# Patient Record
Sex: Female | Born: 1955
Health system: Southern US, Community
[De-identification: ages and names within clinical notes are randomized; demographics above are authoritative.]

## PROBLEM LIST (undated history)

## (undated) HISTORY — PX: ABDOMINAL HYSTERECTOMY: SHX81

## (undated) HISTORY — PX: CHOLECYSTECTOMY: SHX55

---

## 1998-11-10 ENCOUNTER — Encounter: Payer: Self-pay | Admitting: Emergency Medicine

## 1998-11-10 ENCOUNTER — Emergency Department (HOSPITAL_COMMUNITY): Admission: EM | Admit: 1998-11-10 | Discharge: 1998-11-10 | Payer: Self-pay

## 2000-05-27 ENCOUNTER — Encounter: Payer: Self-pay | Admitting: Emergency Medicine

## 2000-05-27 ENCOUNTER — Emergency Department (HOSPITAL_COMMUNITY): Admission: EM | Admit: 2000-05-27 | Discharge: 2000-05-27 | Payer: Self-pay | Admitting: Emergency Medicine

## 2000-07-06 ENCOUNTER — Other Ambulatory Visit: Admission: RE | Admit: 2000-07-06 | Discharge: 2000-07-06 | Payer: Self-pay | Admitting: Obstetrics and Gynecology

## 2001-02-02 ENCOUNTER — Other Ambulatory Visit: Admission: RE | Admit: 2001-02-02 | Discharge: 2001-02-02 | Payer: Self-pay | Admitting: *Deleted

## 2001-02-28 ENCOUNTER — Encounter (INDEPENDENT_AMBULATORY_CARE_PROVIDER_SITE_OTHER): Payer: Self-pay | Admitting: Specialist

## 2001-02-28 ENCOUNTER — Other Ambulatory Visit: Admission: RE | Admit: 2001-02-28 | Discharge: 2001-02-28 | Payer: Self-pay | Admitting: Obstetrics and Gynecology

## 2001-03-21 ENCOUNTER — Encounter (INDEPENDENT_AMBULATORY_CARE_PROVIDER_SITE_OTHER): Payer: Self-pay | Admitting: Specialist

## 2001-03-21 ENCOUNTER — Other Ambulatory Visit: Admission: RE | Admit: 2001-03-21 | Discharge: 2001-03-21 | Payer: Self-pay | Admitting: Obstetrics and Gynecology

## 2001-05-01 ENCOUNTER — Encounter (INDEPENDENT_AMBULATORY_CARE_PROVIDER_SITE_OTHER): Payer: Self-pay | Admitting: Specialist

## 2001-05-01 ENCOUNTER — Observation Stay (HOSPITAL_COMMUNITY): Admission: RE | Admit: 2001-05-01 | Discharge: 2001-05-02 | Payer: Self-pay | Admitting: Obstetrics and Gynecology

## 2001-05-03 ENCOUNTER — Encounter: Payer: Self-pay | Admitting: Family Medicine

## 2001-05-03 ENCOUNTER — Encounter: Admission: RE | Admit: 2001-05-03 | Discharge: 2001-05-03 | Payer: Self-pay | Admitting: Family Medicine

## 2001-05-20 ENCOUNTER — Inpatient Hospital Stay (HOSPITAL_COMMUNITY): Admission: EM | Admit: 2001-05-20 | Discharge: 2001-05-27 | Payer: Self-pay | Admitting: Emergency Medicine

## 2001-05-21 ENCOUNTER — Encounter: Payer: Self-pay | Admitting: Surgery

## 2001-05-21 ENCOUNTER — Encounter: Payer: Self-pay | Admitting: *Deleted

## 2001-05-23 ENCOUNTER — Encounter: Payer: Self-pay | Admitting: Obstetrics and Gynecology

## 2001-05-26 ENCOUNTER — Encounter: Payer: Self-pay | Admitting: Gastroenterology

## 2001-05-27 ENCOUNTER — Encounter: Payer: Self-pay | Admitting: Gastroenterology

## 2001-06-11 ENCOUNTER — Encounter: Payer: Self-pay | Admitting: Surgery

## 2001-06-11 ENCOUNTER — Encounter: Payer: Self-pay | Admitting: Obstetrics and Gynecology

## 2001-06-11 ENCOUNTER — Inpatient Hospital Stay (HOSPITAL_COMMUNITY): Admission: EM | Admit: 2001-06-11 | Discharge: 2001-06-17 | Payer: Self-pay | Admitting: *Deleted

## 2001-06-12 ENCOUNTER — Encounter: Payer: Self-pay | Admitting: Obstetrics and Gynecology

## 2001-06-28 ENCOUNTER — Ambulatory Visit (HOSPITAL_COMMUNITY): Admission: RE | Admit: 2001-06-28 | Discharge: 2001-06-28 | Payer: Self-pay | Admitting: Gastroenterology

## 2001-06-28 ENCOUNTER — Encounter: Payer: Self-pay | Admitting: Gastroenterology

## 2002-04-25 ENCOUNTER — Other Ambulatory Visit: Admission: RE | Admit: 2002-04-25 | Discharge: 2002-04-25 | Payer: Self-pay | Admitting: Obstetrics and Gynecology

## 2003-07-24 ENCOUNTER — Other Ambulatory Visit: Admission: RE | Admit: 2003-07-24 | Discharge: 2003-07-24 | Payer: Self-pay | Admitting: Obstetrics and Gynecology

## 2004-11-17 ENCOUNTER — Other Ambulatory Visit: Admission: RE | Admit: 2004-11-17 | Discharge: 2004-11-17 | Payer: Self-pay | Admitting: Obstetrics and Gynecology

## 2006-08-08 ENCOUNTER — Encounter: Admission: RE | Admit: 2006-08-08 | Discharge: 2006-08-08 | Payer: Self-pay | Admitting: Family Medicine

## 2007-01-26 ENCOUNTER — Encounter: Admission: RE | Admit: 2007-01-26 | Discharge: 2007-01-26 | Payer: Self-pay | Admitting: Occupational Medicine

## 2007-03-29 ENCOUNTER — Encounter: Admission: RE | Admit: 2007-03-29 | Discharge: 2007-06-27 | Payer: Self-pay | Admitting: Occupational Medicine

## 2007-06-28 ENCOUNTER — Encounter: Admission: RE | Admit: 2007-06-28 | Discharge: 2007-09-26 | Payer: Self-pay | Admitting: Occupational Medicine

## 2007-07-19 ENCOUNTER — Ambulatory Visit: Payer: Self-pay | Admitting: Gastroenterology

## 2007-08-14 ENCOUNTER — Ambulatory Visit: Payer: Self-pay | Admitting: Gastroenterology

## 2008-02-05 DIAGNOSIS — T7840XA Allergy, unspecified, initial encounter: Secondary | ICD-10-CM | POA: Insufficient documentation

## 2008-02-05 DIAGNOSIS — C569 Malignant neoplasm of unspecified ovary: Secondary | ICD-10-CM

## 2009-03-20 ENCOUNTER — Encounter: Admission: RE | Admit: 2009-03-20 | Discharge: 2009-03-20 | Payer: Self-pay | Admitting: Family Medicine

## 2009-03-26 ENCOUNTER — Encounter: Admission: RE | Admit: 2009-03-26 | Discharge: 2009-03-26 | Payer: Self-pay | Admitting: Family Medicine

## 2009-04-04 ENCOUNTER — Encounter (INDEPENDENT_AMBULATORY_CARE_PROVIDER_SITE_OTHER): Payer: Self-pay | Admitting: General Surgery

## 2009-04-04 ENCOUNTER — Ambulatory Visit (HOSPITAL_COMMUNITY): Admission: RE | Admit: 2009-04-04 | Discharge: 2009-04-05 | Payer: Self-pay | Admitting: General Surgery

## 2010-04-29 ENCOUNTER — Encounter: Admission: RE | Admit: 2010-04-29 | Discharge: 2010-04-29 | Payer: Self-pay | Admitting: Occupational Medicine

## 2010-12-13 ENCOUNTER — Encounter: Payer: Self-pay | Admitting: Family Medicine

## 2011-03-02 LAB — COMPREHENSIVE METABOLIC PANEL
Albumin: 3.3 g/dL — ABNORMAL LOW (ref 3.5–5.2)
BUN: 7 mg/dL (ref 6–23)
Chloride: 105 mEq/L (ref 96–112)
Creatinine, Ser: 0.63 mg/dL (ref 0.4–1.2)
Total Bilirubin: 0.8 mg/dL (ref 0.3–1.2)
Total Protein: 5.8 g/dL — ABNORMAL LOW (ref 6.0–8.3)

## 2011-03-02 LAB — CBC
HCT: 32.9 % — ABNORMAL LOW (ref 36.0–46.0)
MCV: 91.8 fL (ref 78.0–100.0)
Platelets: 374 10*3/uL (ref 150–400)
RDW: 12.5 % (ref 11.5–15.5)

## 2011-04-06 NOTE — Assessment & Plan Note (Signed)
 HEALTHCARE                         GASTROENTEROLOGY OFFICE NOTE   CHARLANE, WESTRY                      MRN:          [id number]  DATE:08/14/2007                            DOB:          24-Jun-1956    PROBLEM:  Abdominal pain.   The reason Ms. [name] has returned for scheduled followup.  On Clinoril  200 mg daily she reports significant improvement in her pain.  Pain is  either entirely gone or most minimal.  Altogether she is feeling quite  well.   PHYSICAL EXAMINATION:  VITAL SIGNS:  Pulse 104, blood pressure 110/76,  weight 161.   IMPRESSION:  Musculoskeletal abdominal pain.   RECOMMENDATIONS:  Continue Clinoril as needed.  No GI workup at this  time.     Barbette Hair. Arlyce Dice, MD,FACG  Electronically Signed    RDK/MedQ  DD: 08/14/2007  DT: 08/15/2007  Job #: [id number]   cc:   Donia Guiles, M.D.

## 2011-04-06 NOTE — Assessment & Plan Note (Signed)
[facility]                         GASTROENTEROLOGY OFFICE NOTE   DOCIA, KLAR                      MRN:          [id number]  DATE:07/19/2007                            DOB:          June 21, 1956    PROBLEM:  Abdominal  pain.   Ms. [name] is a 55 year old white female referred through the courtesy  of Dr. Arvilla Market for evaluation.  She is complaining of chronic right  lower quadrant pain.  Pain is mild to moderate and is there throughout  the day and night.  She feels it has worsened with prolonged standing  and walking.  It is not affected by bowel movements.  She has had no  change in her bowel habits and has a normal solid stool daily.  There is  no history of melena or hematochezia.  This is pain that developed  following a hysterectomy, complicated by a local recurrent infection.  She underwent colonoscopy in 2002 for a similar problem, which was  entirely normal.  CT at that time raised the question of colonic edema.  In September 2007, she underwent another CT scan for similar complaints.  There was question of intermittent thickening of the colon, throughout  it's course.   PAST MEDICAL HISTORY:  Pertinent for ovarian cancer.   FAMILY HISTORY:  Noncontributory.   MEDICATIONS:  Include ibuprofen daily.   ALLERGIES:  SHE IS ALLERGIC TO SULFA.   SOCIAL HISTORY:  She does not smoke.  She drinks several drinks a week.  She is single.   REVIEW OF SYSTEMS:  Positive for insomnia.   EXAMINATION:  VITAL SIGNS:  Pulse 88, blood pressure 114/70, weight is  156.  ABDOMEN:  Per template, on his abdominal exam, she has moderate  tenderness to palpation in the right lower quadrant, just medial to the  right inguinal area.  There is no guarding or rebound.  Tenderness is  slightly decreased with abdominal wall flexion.   IMPRESSION:  Chronic right lower quadrant abdominal pain.  I suspect  this could be due to adhesions related to a prior  surgery and infection.  I do not think that her pain is GI in origin.  Abdominal wall pain is  another consideration.   RECOMMENDATIONS:  Trial of Clinoril 200 mg twice a day for 7 days and  then p.r.n.  If she is not improved, then I would consider GYN  evaluation with consideration for a laparoscopy.     Barbette Hair. Arlyce Dice, MD,FACG  Electronically Signed    RDK/MedQ  DD: 07/19/2007  DT: 07/20/2007  Job #: [id number]   cc:   Donia Guiles, M.D.

## 2011-04-06 NOTE — Op Note (Signed)
NAME:  Loretta, Rogers               ACCOUNT NO.:  [id number]   MEDICAL RECORD NO.:  [id number]          PATIENT TYPE:  INP   LOCATION:  5125                         FACILITY:  [facility]   PHYSICIAN:  Gabrielle Dare. Janee Morn, M.D.DATE OF BIRTH:  February 28, 1956   DATE OF PROCEDURE:  04/04/2009  DATE OF DISCHARGE:                               OPERATIVE REPORT   PREOPERATIVE DIAGNOSES:  1. Gallbladder polyp.  2. Right-sided abdominal pain.   POSTOPERATIVE DIAGNOSES:  1. Gallbladder polyp.  2. Right-sided abdominal pain.   PROCEDURE:  Laparoscopic cholecystectomy with intraoperative  cholangiogram.   SURGEON:  Gabrielle Dare. Janee Morn, MD   ANESTHESIA:  General endotracheal.   HISTORY OF PRESENT ILLNESS:  [name] is a 55 year old female who I  evaluated in the office earlier this week for right-sided abdominal pain  and workup showing a gallbladder polyp which is highly suggestive of  chronic cholecystitis.  She presents today for elective cholecystectomy.   PROCEDURE IN DETAIL:  The patient was identified preop holding area.  Informed consent was obtained.  She received intravenous antibiotics.  She was brought to the operating room.  General endotracheal anesthesia  was administered by the anesthesia staff.  Her abdomen was prepped and  draped in a sterile fashion.  We did a time-out procedure.  Infraumbilical region was then infiltrated with 0.25% Marcaine with  epinephrine.  Infraumbilical incision was made.  Subcutaneous tissues  were dissected down revealing the anterior fascia.  This was divided  sharply, and the peritoneal cavity is entered under direct vision  without difficulty.  A 0 Vicryl pursestring suture was placed around the  fascial opening, and the Hasson trocar was inserted into the abdomen.  The abdomen was insufflated with carbon dioxide in a standard fashion.  Under direct vision, an 11-mm epigastric and two 5-mm lateral ports were  placed.  Marcaine 0.25% with epinephrine  was used at all port sites.  Due to the patient's abdominal pain on the entirety of her right side,  we first began inspection of the right pelvis and right lower quadrant.  The appendix was completely normal with no evidence of inflammation.  The right ovary appeared normal.  The terminal ileum was run back for  several feet.  There was no evidence of Meckel diverticulum.  There were  no significant adhesions or other abnormalities in the pelvis with no  abnormalities found.  We turned our attention to the gallbladder.  The  dome was retracted superomedially revealing a large number of filmy  adhesions of omentum to the body of the gallbladder.  This was  consistent with chronic cholecystitis.  The filmy adhesions were gently  swept down revealing the body and infundibulum.  Once this was cleared  away, the infundibulum was then retracted inferolaterally.  Dissection  began laterally and progressed medially easily identifying the cystic  duct and the cystic artery.  Cystic artery was dissected out, it was  clipped twice proximally and once distally.  The cystic duct was then  further dissected until a large window was created between the cystic  duct, infundibulum of the gallbladder, and  the liver.  Once this was  done with excellent visualization, we placed a clip on the infundibulum-  cystic duct junction.  A small nick was then made in the cystic duct.  Cook cholangiogram catheter was inserted.  Intraoperative cholangiogram  was obtained demonstrating good filling of the duodenum and no filling  defects in the common bile duct or common hepatic duct.  Cook catheter  was removed.  Three clips were placed proximally on the cystic duct and  then it was divided.  The cystic artery was then divided between the  previous clips.  The gallbladder was taken off the liver bed with Bovie  cautery achieving excellent hemostasis along the way.  The gallbladder  was placed in the EndoCatch bag and  removed from the abdomen via the  infraumbilical port sites.  The abdomen was copiously irrigated with  saline until clear.  The liver bed was rechecked, and hemostasis was  obtained using Bovie cautery.  Clips remained in excellent position.  Remainder of the irrigation fluid was evacuated and it was clear.  Liver  bed was rechecked, it was completely dry.  Clips remained in excellent  position.  Ports were then removed under direct vision.  Pneumoperitoneum was released.  The infraumbilical fascia was closed by  tying a 0 Vicryl pursestring suture.  All 4 wounds were copiously  irrigated, and the skin of each was closed with running 4-0 Vicryl  subcuticular stitch followed by Dermabond.  Sponge, needle, and  instrument counts were all correct.  The patient tolerated the procedure  well without apparent complication and taken to the recovery room in  stable condition.      Gabrielle Dare Janee Morn, M.D.  Electronically Signed     BET/MEDQ  D:  04/04/2009  T:  04/05/2009  Job:  [id number]   cc:   Donia Guiles, M.D.

## 2011-04-09 NOTE — Discharge Summary (Signed)
[facility]  Patient:    [name], [name]                 MRN: [id number] Adm. Date:  [id number] Disc. Date: [id number] Attending:  Brynda Peon                           Discharge Summary  DISCHARGE DIAGNOSES: 1. Right lower quadrant pain, possibly secondary to pelvic adhesions. 2. Rapidly defervescing fever of unknown origin.  HISTORY OF PRESENT ILLNESS:  The patient is a 55 year old, gravida 2, para 2, who is status post vaginal hysterectomy on June 10, for CIN III with endocervical glandular involvement.  She had an uneventful postoperative course, and was sent home on her first postoperative day, but returned on June 29, with right lower quadrant pain and a fever, and an area in the terminal ileum and the cecum that was edematous and consistent with Crohns disease. She was treated with IV steroids and antibiotics, and had a colonoscopy which was negative.  Her pain resolved.  Her fever defervesced.  She was discharged. She came in again at this admission, again complaining of right lower quadrant pain and having a temperature of 102 degrees.  She was admitted, and again started on IV antibiotics and a GI consultation was obtained.  She was started on Unasyn 1.5 g IV q.6h.  INCOMPLETE DD:  07/04/01 TD:  07/04/01 Job: [id number]

## 2011-04-09 NOTE — H&P (Signed)
[facility]  Patient:    [name], [name]                 MRN: [id number] Adm. Date:  [id number] Attending:  Verneita Griffes Dictator:   Mike Gip, P.A. CC:         Th[place]. Daphine Deutscher, M.D.  Cynthia P. Ashley Royalty, M.D.   History and Physical  CHIEF COMPLAINT:  Acute right lower quadrant pain and fever.  HISTORY OF PRESENT ILLNESS:  [name] is a pleasant 55 year old white female, generally previously in good health, who underwent a vaginal hysterectomy on May 02, 2001, for CIN.  She was discharged to home the following day, and required admission to [facility] on May 20, 2001, with complaints of gradually progressive right-sided abdominal pain and fever.  She had fairly extensive workup during that admission, as well as GI and surgical consultation.  Ultrasound initially showed possible appendicitis with a fluid filled structure in the right lower quadrant, right and left ovary were felt to be okay with a small cyst in the left ovary.  CT scan was then done on 6/30, showing marked wall thickening of the terminal ileum and cecum, raising the possibility of Crohns disease or an infection.  The appendix was visualized and felt to be normal on CT scan.  The patient was treated with IV antibiotics and symptomatically improved.  She was also covered empirically with Asacol and steroids, given the possibility of an underlying inflammatory bowel disease.  However, interestingly, the patient had no antecedent GI symptoms prior to her hysterectomy, suggestive of inflammatory bowel disease. She underwent colonoscopy with Dr. Arlyce Dice on 05/26/01, which was a normal exam to the cecum.  He did not intubate the terminal ileum.  There were no mucosal abnormalities noted.  She also had small bowel follow through which was read as normal, including visualization of the terminal ileum.  The patient was ultimately discharged to home on 05/27/01, to complete a  10 day course of Cipro 500 mg b.i.d., Flagyl 250 mg t.i.d.  She was also discharged on Protonix, a rapid taper of prednisone, and was to follow up with Dr. Arlyce Dice in the office. The patient states that she gradually felt better after discharge, and was able to eat without discomfort, was having normal bowel movements.  She finished her antibiotics and tapered off of her steroids.  She says she has noticed some residual minimal right lower quadrant discomfort which was more noticeable with walking for exercise, and was not aggravated by eating.  She was actually seen in the office by Dr. Arlyce Dice on Wednesday, 7/17.  At that time, she was felt to be nontender and felt that she had resolving inflammatory process which would not require further evaluation.  She says thereafter her pain gradually increased, and was abruptly worse on Friday, 06/09/01.  By the morning of 7/21, she had also developed a fever at home to 101.  She presented back to the emergency room.  Initially was seen by family practice and then GI, and readmitted for further diagnostic evaluation.  CURRENT MEDICATIONS:  None on a regular basis.  ALLERGIES:  No known drug allergies.  PAST MEDICAL HISTORY:  Benign.  PAST SURGICAL HISTORY:  Status post vaginal hysterectomy in 6/02, secondary to CIN.  There was no invasive carcinoma noted on pathology at the time of her surgery.  No other known chronic medical problems.  SOCIAL HISTORY:  The patient is single, has a supportive family.  No ETOH  and no tobacco.  FAMILY HISTORY:  Pertinent for father deceased with prostate cancer.  REVIEW OF SYSTEMS:  CARDIOVASCULAR:  Denies any chest pain or anginal symptoms.  PULMONARY:  Negative for cough, shortness of breath, or sputum production.  GASTROINTESTINAL:  As above.  GENITOURINARY:  No dysuria, urgency, or frequency.  NEUROLOGIC:  The patient has had some complaint of headache over the past couple of days.  PHYSICAL  EXAMINATION:  GENERAL:  The patient is a well-developed, ill-appearing white female in moderate distress at the time of admission secondary to pain.  She is alert and oriented x 3.  VITAL SIGNS:  Temperature 98.2, pulse 118, blood pressure 89/56.  HEENT:  Normocephalic, atraumatic.  Extraocular movements intact.  Pupils are equal, round and reactive to light and accommodation.  Sclerae anicteric.  NECK:  Supple without nodes.  CHEST:  Clear to auscultation and percussion.  CARDIOVASCULAR:  Regular rate and rhythm with S1 and S2 slightly tachy.  ABDOMEN:  Soft.  She is exquisitely tender in the right lower quadrant to palpation.  There is guarding, but no rebound, no palpable mass or hepatosplenomegaly.  RECTAL:  Not done at the time of readmission.  PELVIC:  To be done per Dr. Elana Alm.  Pelvic per Dr. Elana Alm showed no masses and some expected postoperative induration per Dr. Caryl Pina note.  LABORATORY DATA:  On admission, urinalysis was negative.  WBC is 31.6 with greater than 20% bands, hemoglobin 10.9, hematocrit 31.0, MCV 86, platelets 444.  Prothrombin time 14.8, INR 1.2.  Liver function tests unremarkable.  IMPRESSION: 1. A 55 year old white female with recurrent inflammatory process in the right    lower quadrant, rule out appendicitis, abscess, Crohns disease, although    no evidence of inflammatory bowel disease noted on recent workup. 2. Status post vaginal hysterectomy on 05/02/01, for CIN. 3. Right lower quadrant inflammatory process on 05/20/01, requiring    hospitalization with extensive workup negative for inflammatory bowel    disease at that time.  PLAN:  The patient is admitted to the service of Dr. Melvia Heaps.  She will initially be placed on IV fluids, given morphine and Compazine as needed for pain and nausea.  She will be covered with IV antibiotics, kept n.p.o., we will check a CT scan of the abdomen and pelvis urgently, and ask for  surgical  consultation today on the day of admission as well. DD:  06/12/01 TD:  06/12/01 Job: [id number]

## 2011-04-09 NOTE — Op Note (Signed)
[facility]  Patient:    [name], [name]                 MRN: [id number] Proc. Date: 06/15/01 Adm. Date:  [id number] Attending:  Verneita Griffes                           Operative Report  PREOPERATIVE DIAGNOSES:  Right lower quadrant pain, leukocytosis and fever, etiology undetermined.  POSTOPERATIVE DIAGNOSES:  Right lower quadrant pain, leukocytosis and fever, etiology undetermined. Pelvic adhesions.  PROCEDURE:  Diagnostic laparoscopy, lysis of adhesions.  SURGEON:  Cynthia P. Ashley Royalty, M.D.  INTRAOPERATIVE CONSULTATION:  Angelia Mould. Derrell Lolling, M.D.  ANESTHESIA:  General endotracheal.  ESTIMATED BLOOD LOSS:  Minimal.  COMPLICATIONS:  None.  FINDINGS:  On very careful inspection of the abdomen and pelvis, it could seen that the cecum was absolutely normal, the appendix was freely mobile and was normal as was the terminal ileum. The gallbladder, liver, tip of the spleen and the stomach were all visualized and were normal. The omentum was normal as well. In the pelvis, the tubes and ovaries bilaterally were normal and mobile. There were some adhesions of the appendices epiploica to the vaginal cuff. There was no evidence of any pelvic or abdominal abscess or inflammatory process whatsoever.  DESCRIPTION OF PROCEDURE:  The patient was taken to the operating room and after the induction of adequate general endotracheal anesthesia was placed in the dorsal lithotomy position and prepped and draped in the usual fashion. A Foley catheter was inserted. A sponge on a sponge stick was inserted into the vagina for upward mobility intraoperatively. A infraumbilical incision was made and the Veress needle inserted into the peritoneal space. Proper placement was tested by noting a negative aspirate and then free flow of normal saline through the Veress needle again with a negative aspirate and then by noting the response of a drop of saline placed at  the hub of Veress needle to negative pressure as the abdominal wall was elevated. Pneumoperitoneum was created with 2 liters of CO2 using the automatic insufflator. The disposable 10/11 mm trocar was inserted and its proper placement noted with the laparoscope. Two suprapubic incisions on the right and the left were made and the 5 mm trocars were inserted under direct visualization. The pelvis and abdomen were inspected with the above findings. The adhesions of the appendices epiploica to the vaginal cuff were taken down with sharp and blunt dissection, no bleeding was encountered. The ovaries and tubes were inspected and felt to be normal. The ureter was clearly seen on the patients right side and was of normal caliber. It could be seen to peristals when it was touched with a grasper. The terminal ileum, cecum, and appendix were all normal. There was no evidence of an inflammatory process in the abdomen. In the upper abdomen, it was inspected carefully. There was no free fluid in the gutters. The liver, gallbladder, stomach, and spleen all appeared normal. During surgery, it was impossible to take still photographs because the printer was not functioning during the case and it was unable to be made to function while the procedure was ongoing; therefore, a video tape documentation was done but there are no still pictures. After lysing the adhesions, irrigating the abdomen and pelvis, the instruments were removed from the abdomen, the pneumoperitoneum was allowed to escape, the sleeves were removed. The incisions were closed subcuticularly with 4-0 plain gut. The instruments were  removed from the vagina and the procedure was terminated. The patient tolerated the procedure well and went in satisfactory condition to post anesthesia recovery. DD:  06/15/01 TD:  06/15/01 Job: [id number]

## 2011-04-09 NOTE — Op Note (Signed)
[facility]  Patient:    [name], [name] [facility]                    MRN: [id number] Proc. Date: 05/01/01 Adm. Date:  [id number] Attending:  Brynda Peon                           Operative Report  PREOPERATIVE DIAGNOSIS:  Cervical intraepithelial neoplasia, grade 3 with glandular extension.  POSTOPERATIVE DIAGNOSIS:  Cervical intraepithelial neoplasia, grade 3 with glandular extension.  Pathology pending.  PROCEDURE:  Total vaginal hysterectomy.  SURGEON:  Cynthia P. Ashley Royalty, M.D.  ASSISTANT:  Andres Ege, M.D.  ANESTHESIA:  General endotracheal.  ESTIMATED BLOOD LOSS:  50 cc.  COMPLICATIONS:  None.  DESCRIPTION OF PROCEDURE:  The patient was taken to the operating room and after the induction of adequate general endotracheal anesthesia, was prepped and draped in the usual fashion and bladder drained with a red rubber catheter.  The cervix was grasped with a Christella Hartigan tenaculum, and approximately 8 cc of 1% Xylocaine with epinephrine was injected submucosally.  The knife was used to incise the mucosa off the cervix, and the bladder was pushed anteriorly.  A posterior colpotomy incision was made, and the posterior Bonnano retractor was placed into the peritoneal space.  The uterosacral ligaments were clamped, cut, and tied with 0 Vicryl.  Attention was next turned anteriorly.  The attempt was made to enter the anterior peritoneum, and this was unsuccessful at this point.  Therefore, the cardinal ligaments were clamped, cut, and tied as were the uterine arteries, and then attention was then turned again to the anterior peritoneum.  The reflection was very high up on the uterus and was at this point entered without difficulty.  The hysterectomy continued up the broad ligament, clamping, cutting, and tying in sequence.  The fundus was delivered through the posterior cul-de-sac with a Lahey clamp, and the pedicle containing the tubes, the  round and the tuboovarian ligament was clamped, cut, and doubly tied with 0 Vicryl.  These pedicles were held for later inspection.  The vaginal cuff was then run from 2 oclock to 10 oclock with a running locking suture of 0 Vicryl, incorporating the peritoneum into the stitch with the vaginal mucosal margin.  A McCall stitch was then placed by entering the posterior peritoneum in the midline, stitching the uterosacral ligament on the left and then taking the peritoneum across the base posteriorly, again in the right uterosacral ligament, and coming out again in the midline and tying.  This very effectively brought the uterosacral ligaments together in the midline.  The wound was then irrigated and was hemostatic.  The pedicles were inspected on the adnexal pedicle, and they were hemostatic, and the suture was cut, and they were allowed to retract.  The vaginal cuff was then closed with three figure-of-eight sutures of 0 Vicryl.  The vagina was then irrigated.  A Foley catheter was put in, and the procedure was terminated.  The patient tolerated it well and went in satisfactory condition to postanesthesia recovery.  Sponge, needle and instrument counts were correct x 3. DD:  05/01/01 TD:  05/01/01 Job: [id number]

## 2011-04-09 NOTE — Discharge Summary (Signed)
[facility]  Patient:    [name], [name]                 MRN: [id number] Adm. Date:  [id number] Disc. Date: [id number] Attending:  Brynda Peon                           Discharge Summary  DISCHARGE DIAGNOSES: 1. Right lower quadrant pain, possibly secondary to pelvic adhesions. 2. Rapidly defervescing fever of unknown origin.  HISTORY OF PRESENT ILLNESS:  The patient is a 55 year old, gravida 2, para 2, who is status post vaginal hysterectomy on June 10, for CIN III with endocervical glandular involvement.  She had an uneventful postoperative course, and was sent home on her first postoperative day, but returned on June 29, with right lower quadrant pain and a fever, and an area in the terminal ileum and the cecum that was edematous and consistent with Crohns disease. She was treated with IV steroids and antibiotics, and had a colonoscopy which was negative.  Her pain resolved.  Her fever defervesced.  She was discharged. She came in again at this admission, again complaining of right lower quadrant pain and having a temperature of 102 degrees.  She was admitted, and again started on IV antibiotics and a GI consultation was obtained.  She was started on Unasyn 1.5 g IV q.6h., and her fever rapidly went away.  A repeat CT scan showed the cecum to be normal, the appendix was normal, and there was no free fluid.  She underwent a diagnostic laparoscopy on 06/15/01.  She had some filmy adhesions on the appendices at the ______ to the vaginal cuff, but there were no other findings.  An intraoperative consultation was also done with the general surgery service, and Dr. Derrell Lolling very carefully evaluated the ileum, the cecum, the small bowel, and found all to be normal.  The patients pain resolved after her laparoscopy.  She was afebrile.  She was continued on her IV Unasyn until 06/17/01, at which time she said her pain was gone, and she was felt to be  in satisfactory condition for discharge.  DISCHARGE MEDICATIONS:  Septra DS one p.o. b.i.d., as she had been battling an upper respiratory infection for some time.  She was told to call for recurrent pain or fever.  FOLLOWUP:  Was asked to follow up with me in two weeks for her postoperative check after her laparoscopy.  LABORATORY DATA:  White count on admission was 31,600 with a left shift.  On discharge, her white count was 5.3.  Hemoglobin on admission was 10.9, on discharge 8.9.  Platelet counts were normal.  Her left shift had resolved over the course of her stay.  Her PT and PTT were normal.  Routine chemistries showed her glucose to be slightly elevated, but she was getting IV fluids at the time.  The highest it was, was 145.  Her liver enzymes were normal.  A UA was negative.  Blood cultures showed no growth.  Stools for O&P were negative. Preliminary report for acid fast bacilli was negative, but culture would be held for six weeks before final report was issued.  A chest x-ray showed the lungs were clear.  A vaginal ultrasound was done to check the ovaries and the vaginal cuff, which were essentially normal.  The ovaries were unremarkable. There was no free fluid.  Because of the shortness of breath she had been having that was probably  secondary to an upper respiratory infection or some bronchitis, she did have a V/Q scan which was normal.  Her CT of the abdomen and pelvis showed no evidence of for abnormality in the abdomen or the pelvis. D:  07/04/01 TD:  07/04/01 Job: [id number]

## 2011-04-09 NOTE — H&P (Signed)
[facility]  Patient:    [name], [name]                 MRN: [id number] Adm. Date:  [id number] Attending:  Verneita Griffes                         History and Physical  CHIEF COMPLAINT:  Continued abdominal pain.  HISTORY OF PRESENT ILLNESS:  [name] is a 55 year old gravida 2, para 2, female status post tubal ligation, who underwent a vaginal hysterectomy by Dr. Altamese Dilling on June 10 for severe dysplasia of the cervix.  Her surgery was uncomplicated.  She went home after two days but states on the day of discharge she had a 99.2 temperature.  Very soon thereafter on June 29, she was readmitted with abdominal pain and what was felt to be possible Crohns disease with findings of thickening of the cecum.  She was seen by the surgical service as well as the GI service.  Basically the workup at that time revealed an elevated white count but no other findings.  Colonoscopy was said to be normal.  Small bowel follow-through was said to be normal.  She now reports to the emergency room with shortness of breath with a temperature of 98.2 for evaluation.  PAST MEDICAL HISTORY:  Two uncomplicated births, last delivery was by Dr. Lucienne Minks, and he did a tubal sterilization.  She denies any medical history.  REVIEW OF SYSTEMS:  HEENT:  No frequent headaches or dizziness.  No decrease in visual or auditory acuity.  CARDIOVASCULAR:  No history of hypertension, no rheumatic fever.  In fact, she states that her blood pressure is always quite low.  She denies chest pain.  She denies history of any heart murmur or rheumatic fever. CARDIOPULMONARY:  Other than she stated that she had a little upper respiratory infection on the day of discharge from her hysterectomy, is unremarkable.  GASTROINTESTINAL:  She states that when she was admitted on June 29, she had had some episodes of diarrhea but that she has up to three stools a day, which are soft.  No  cramps, and certainly she describes no diarrhea, according to the patient.  GENITOURINARY:  She has no history of UTIs or problems in this regard.  No history of nephritis.  FAMILY HISTORY:  Totally unremarkable according to the patient.  No history of cancer or hypertension in her family.  PHYSICAL EXAMINATION:  VITAL SIGNS:  Blood pressure of 89/50, pulse is 118, respirations 38, temperature of 98.  GENERAL:  A well-developed, anxious female, who is somewhat pale.  HEENT:  Unremarkable.  The pupils are round and regular, react to light and accommodation.  Oropharynx is not injected.  NECK:  Supple.  Thyroid is not enlarged.  No carotid bruits are heard. Certainly no adenopathy is felt.  BREASTS:  No masses or tenderness.  CHEST:  Lungs are clear to P&A.  Diaphragms move well with inspiration and expiration.  CARDIAC:  Normal sinus rhythm, no murmurs, and no heaves, thrills, rubs, or gallops.  There is a sinus tachycardia of 120 beats a minute.  ABDOMEN:  The abdomen is _____.  No incisions are noted.  Bowel sounds appear appropriate at this time.  She has some voluntary guarding in the right midquadrant, but no mass is felt.  Again, no hyperactive bowel sounds are noted.  EXTREMITIES:  Good range of motion, equal pulses and reflexes.  PELVIC:  An induration bilaterally without masses or tenderness.  RECTAL:  Hemoccult is negative.  IMPRESSION:  Postop patient with abdominal pain, shortness of breath, and elevated white count.  PLAN:  Admission and evaluation. DD:  06/11/01 TD:  06/12/01 Job: [id number]

## 2011-04-09 NOTE — H&P (Signed)
[facility]  Patient:    [name], [name]                 MRN: [id number] Adm. Date:  [id number] Disc. Date: [id number] Attending:  Brynda Peon                         History and Physical  CHIEF COMPLAINT:  Pain, elevated temperature.  HISTORY OF PRESENT ILLNESS:  The patient is a 55 year old who underwent vaginal hysterectomy for CIN on May 02, 2001, by Dr. Altamese Dilling.  She was released the following morning with temperature elevation at 99.5 according to the patient.  She had probable pneumonitis or something in her lungs postoperatively again according to the patient for which she was seen by her primary care physician and placed on Augmentin which she has continued from approximately May 05, 2001, through May 12, 2001.  She began having severe pain at approximately 5 p.m. on May 20, 2001, with swings in temperature from 98 up to over 100 degrees.  The pain became gradually more intense on the evening of May 20, 2001, and the patient presented at the emergency room at [facility].  She is admitted at this time for observation and intravenous antibiotics.  PAST MEDICAL HISTORY:  Hysterectomy as noted above for probable CIN.  Her ovaries have been retained.  She has previous laparoscopic tubal ligation approximately 10 years ago.  ALLERGIES:  She is allergic to no medications and has taken only Advil recently.  FAMILY HISTORY:  Father died of prostate cancer.  REVIEW OF SYSTEMS:  HEENT:  Severe headache beginning yesterday afternoon and continuing through today.  Otherwise negative.  Cardiorespiratory as noted above.  GI:  Denies nausea, vomiting or diarrhea.  GU:  No symptoms of urinary tract problems but GYN history as noted above.  Neuromuscular:  Negative.  PHYSICAL EXAMINATION: VITAL SIGNS:  Blood pressure 121/68.  Pulse 130, respirations 24, temperature initially 98.2 at 2115 and 100.6 at 2220.  GENERAL:   Well-developed, well-nourished white female in moderate to severe distress complaining of pain and turning in obvious distress.  HEENT:  Within normal limits except mucous membranes somewhat dry.  NECK:  Supple without masses, adenopathy or bruits.  HEART:  No murmurs but tachycardia as noted above.  LUNGS:  No obvious rales or rhonchi noted.  ABDOMEN:  Flat but the patient guards throughout particularly on the right upper and lower quadrants which are tender with some rebound present. Palpation in the left lower quadrant is slightly tender but without obvious rebound.  Bowel sounds are decreased to absent.  PELVIC:  External genitalia, Bartholins, urethra and skeins glands are within normal limits.   Vagina is clean.  Cuff is healing.  On bimanual exam, there is some fullness in the right adnexal area with marked tenderness in this area.  A definite mass cannot be palpated secondary to the patients guarding.  EXTREMITIES:  Within normal limits.  NEUROLOGICAL:  Central nervous system grossly intact.  SKIN:  Again somewhat dry.  IMPRESSION: 1. Status post vaginal hysterectomy. 2. Pain and increased temperature.  DISPOSITION:  Admit for intravenous antibiotics and observation. DD:  05/20/01 TD:  05/20/01 Job: [id number]

## 2011-04-09 NOTE — Discharge Summary (Signed)
[facility]  Patient:    [name], [name]                 MRN: [id number] Adm. Date:  [id number] Disc. Date: [id number] Attending:  Brynda Peon CC:         Barbette Hair. Arlyce Dice, M.D. [facility]  Desma Maxim, M.D.   Discharge Summary  DISCHARGE DIAGNOSIS:  Colitis involving the cecum, status post total vaginal hysterectomy for CIN-3.  HISTORY OF PRESENT ILLNESS:  This is a 55 year old white female who underwent a vaginal hysterectomy for CIN-3 on June 11.  She returned on the evening of June 29 complaining of increasing right lower quadrant pain over the course of that day and temperatures in the 99-100 degree range.  Approximately two weeks previous to this episode, she had seen her primary care doctor for an upper respiratory infection, for which she was placed on Augmentin for a week.  She was admitted to the hospital and started on ampicillin, gentamicin, and Cleocin.  A surgical and GI consult were obtained.  Initial white count was 21.2 with 86 neutrophils.  Her H&H was 11.5 and 32.7.  She had toxic granulations.  Her sed rate was 25.  Her electrolytes were normal.  Her serum iron was less than 10 and her B12 binding capacity was 2635.  A UA was negative.  A stool for WBC showed a few WBCs.  Stool culture was negative and C. difficile toxin was negative.  Pelvic ultrasound showed a 2.5 cm cyst on her left ovary.  The right ovary was completely normal.  The ultrasound did show some inflammation in the right lower quadrant.  A CT scan was done which showed marked thickening in the wall of the terminal ileum and the cecum and it was consistent with Crohns disease or an infectious process and p.o. Flagyl was added to the patients regimen by the GI consultation, Dr. Arlyce Dice, and the C. difficile toxin was obtained approximately 36 hours after the Flagyl was started.  On this regimen of ampicillin, gentamicin, Cleocin and p.o. Flagyl, the patient  continued to improve over the course of the hospitalization.  By her third day on this regimen, she was felt to be stable to discontinue her IV ampicillin, gentamicin, and Cleocin and just change to Unasyn 1.5 g q.6h. and she was prepped for a colonoscopy.  Her diet was gradually increased.  She had been on IV Solu-Medrol and this was changed to p.o. prednisone on July 3.  She had a small bowel follow-through on July 6 and that was normal and her antibiotics were changed to Cipro p.o. 500 b.i.d. and Flagyl 250 p.o. t.i.d.  A colonoscopy was performed and was normal.  The patient remained afebrile essentially throughout her hospital stay, except on the night of admission when she was 101.5.  After that, her temperature was 98-99.  When she was discharged, she had been below 98 for more than 24 hours. The colonoscopy done by Dr. Arlyce Dice on May 26, 2001 was entirely normal and both from an OB/GYN and a GI standpoint it was felt that the patient was in satisfactory condition for discharge.  She was tolerating a normal diet.  Her right lower quadrant pain was essentially gone, possibly some slight soreness in the right lower quadrant.  She was given discharge instructions and sent home on May 27, 2001 afebrile and in good condition.  DISCHARGE MEDICATIONS: 1. Flagyl and Cipro, as mentioned above. 2. Protonix 40 mg every  morning. 3. Prednisone, a rapid taper.  LABORATORY DATA:  Her white count was down to 11.7 at discharge.  H&H was stable at 9.9 and 29.1.  Stool for occult blood had been positive on two occasions. DD:  06/06/01 TD:  06/06/01 Job: [id number]

## 2011-04-09 NOTE — Consult Note (Signed)
[facility]  Patient:    [name], [name]                 MRN: [id number] Proc. Date: 05/20/01 Adm. Date:  [id number] Attending:  Osvaldo Human CC:         Edwena Felty. Ashley Royalty, M.D.  Almedia Balls. Randell Patient, M.D.   Consultation Report  ACCOUNT NUMBER:  [id number]  CHIEF COMPLAINT:  Abdominal pain.  CLINICAL HISTORY:  I was asked by Dr. Randell Patient to see the patient, who presented to the emergency room with about a one-week history of progressive right lower abdominal pain.  She is almost three weeks status post vaginal hysterectomy, she says for cancer.  She has had markedly increasing pain today and fever but, in speaking with her, she actually says that she had, for the last week, some right lower abdominal pain which has gradually gotten worse.  Today, she has been nauseated but she had not been nauseated prior to that.  She has not had any fever or chills.  She has had no bowel movements.  She had a little bit of a loose stool today.  She has had no blood in her stool.  She has had no urinary tract symptoms.  She has had no cough or upper respiratory tract infection-type symptoms although, immediately postoperatively, she apparently developed pneumonitis and was treated with Augmentin.  Because of her increasing abdominal pain, she presented to the emergency room.  PAST MEDICAL HISTORY:  Basically unremarkable.  She has been in generally good health.  She has no known allergies.  Her only medication has been Advil. Details are noted in her old chart.  PHYSICAL EXAMINATION:  GENERAL:  The patient is a healthy-appearing female who is alert and oriented and appears somewhat uncomfortable.  VITAL SIGNS:  She is slightly tachycardic with a heart rate of about 130.  HEENT:  Normocephalic.  Eyes anicteric.  Pupils round and regular.  NECK:  Supple.  No masses or thyromegaly.  LUNGS:  Clear to auscultation.  HEART:  Regular.  No murmurs, rubs, or  gallops.  ABDOMEN:  Marked right-sided tenderness, particularly in the right lower quadrant.  There is some little bit on the left side.  I do not really appreciate any lower abdominal suprapubic tenderness.  EXTREMITIES:  Unremarkable.  LABORATORY DATA:  White count 21,000.  Urinalysis was negative.  CMET not available.  Ultrasound is interpreted as showing some fluid in the right lower quadrant, but they do not actually see the appendix.  IMPRESSION:  Right lower abdominal pain three weeks status post vaginal hysterectomy.  Possibly appendicitis.  Possibly some intra-abdominal process secondary to her surgery.  PLAN:  Dr. Randell Patient has already begun IV hydration with D5RL and begun IV antibiotics with ampicillin, clindamycin and gentamicin.  We are going to go ahead and get a CT scan to see if we can further delineate the issues here before making a decision about whether or not operative intervention would be indicated.  Will also check CMET on her. DD:  05/21/01 TD:  05/21/01 Job: [id number]

## 2011-08-03 ENCOUNTER — Other Ambulatory Visit: Payer: Self-pay | Admitting: Occupational Medicine

## 2011-08-03 ENCOUNTER — Ambulatory Visit: Payer: Self-pay

## 2011-08-03 DIAGNOSIS — M79671 Pain in right foot: Secondary | ICD-10-CM

## 2013-04-17 ENCOUNTER — Emergency Department (HOSPITAL_COMMUNITY)
Admission: EM | Admit: 2013-04-17 | Discharge: 2013-04-17 | Disposition: A | Discharge status: Home / Self Care | Payer: BC Managed Care – PPO | Source: Home / Self Care

## 2013-04-17 ENCOUNTER — Encounter (HOSPITAL_COMMUNITY): Payer: Self-pay | Admitting: *Deleted

## 2013-04-17 DIAGNOSIS — L508 Other urticaria: Secondary | ICD-10-CM

## 2013-04-17 MED ORDER — DIPHENHYDRAMINE HCL 25 MG PO CAPS
ORAL_CAPSULE | ORAL | Status: AC
  Filled 2013-04-17: qty 1

## 2013-04-17 MED ORDER — HYDROXYZINE HCL 10 MG PO TABS: 10.0000 mg | ORAL_TABLET | Freq: Three times a day (TID) | ORAL | Status: DC | PRN

## 2013-04-17 MED ORDER — DIPHENHYDRAMINE HCL 25 MG PO CAPS
25.0000 mg | ORAL_CAPSULE | Freq: Once | ORAL | Status: AC
  Administered 2013-04-17: 25 mg via ORAL

## 2013-04-17 MED ORDER — TRIAMCINOLONE ACETONIDE 0.1 % EX CREA: TOPICAL_CREAM | Freq: Two times a day (BID) | CUTANEOUS | Status: DC

## 2013-04-17 NOTE — ED Provider Notes (Signed)
History     CSN: [id number]  Arrival date & time 04/17/13  1047   None     Chief Complaint  Patient presents with  . Allergic Reaction    (Consider location/radiation/quality/duration/timing/severity/associated sxs/prior treatment) HPI 57 yo otherwise healthy woman coming in with an allergic reaction.  She states that yesterday she was helping her husband cut down a hickory tree when she noticed some itching of her right jaw and chin.  She took some benadryl and used some benadryl gel with helped with the itching.  Over the course of the evening the itching spread to her chest and neck and she noted that it was red and had some "whelps."  She reports wearing gloves and a long sleeved t-shirt while working with the tree yesterday, but does remember swiping at her neck and chest a few times.  She does state that she has used a new detergent the last week or so, but does not have the rash anywhere else.  Denies any lip or tongue swelling, no trouble breathing or difficulty swallowing.  History reviewed. No pertinent past medical history.  History reviewed. No pertinent past surgical history.  History reviewed. No pertinent family history.  History  Substance Use Topics  . Smoking status: Not on file  . Smokeless tobacco: Not on file  . Alcohol Use: Yes  Does not smoke, drink, or use drugs.  OB History   Grav Para Term Preterm Abortions TAB SAB Ect Mult Living                  Review of Systems  HENT: Negative for sore throat, drooling and trouble swallowing.   Respiratory: Negative for chest tightness and shortness of breath.   Skin: Positive for rash.  All other systems reviewed and are negative.    Allergies  Review of patient's allergies indicates no known allergies.  Home Medications   Current Outpatient Rx  Name  Route  Sig  Dispense  Refill  . DiphenhydrAMINE HCl (BENADRYL PO)   Oral   Take by mouth.         . Loratadine (CLARITIN PO)   Oral   Take by  mouth.           BP 122/80  Pulse 72  Temp(Src) 98.6 F (37 C) (Oral)  Resp 16  SpO2 100%  Physical Exam  Constitutional: She is oriented to person, place, and time. She appears well-developed and well-nourished. No distress.  HENT:  Head: Normocephalic and atraumatic.  Mouth/Throat: Oropharynx is clear and moist. No oropharyngeal exudate.  Eyes: Conjunctivae and EOM are normal. Right eye exhibits no discharge. Left eye exhibits no discharge.  Neck: Normal range of motion.  Cardiovascular: Normal rate, regular rhythm, normal heart sounds and intact distal pulses.   No murmur heard. Pulmonary/Chest: Effort normal and breath sounds normal. No respiratory distress. She has no wheezes. She has no rales.  Neurological: She is alert and oriented to person, place, and time. She exhibits normal muscle tone.  Skin: Skin is warm and dry. Rash (erythematous urticarial rash over superior chest and back of the neck, no wounds or drainage) noted. She is not diaphoretic.  Psychiatric: She has a normal mood and affect. Thought content normal.    ED Course  Procedures (including critical care time)  Labs Reviewed - No data to display No results found.   No diagnosis found.    MDM  57 yo otherwise healthy woman with allergic reaction.  Reaction limited to  skin.  Will treat with triamcinolone cream BID, hydroxyzine q8hr prn, and benadryl gel as needed.  Discussed using ice or calamine lotion as well to help with itching.  Should start to improve over next 24-48 hours.  Will return or see her PCP if not improving.  Will return if develops shortness of breath, trouble breathing or trouble swallowing. Discussed avoidance to prevent future problems.         Phebe Colla, MD 04/17/13 [phone]

## 2013-04-17 NOTE — ED Notes (Signed)
Pt  Reports  Symptoms  Of  A  Rash   With  Some   Small  Whelps       On   Upper  Chest    And  Neck  Which  Started  yest     She  Was  Outside  Working in  [employer]  She  Reports  Itching  Present  The  Skin  Is  Red       She  Is  Speaking in  Complete  sentances  No  Angioedema  Nor  Any  resp  Distress      She  Took  Benadryl with  No  releif

## 2013-04-17 NOTE — ED Provider Notes (Signed)
Medical screening examination/treatment/procedure(s) were performed by a resident physician and as supervising physician I was immediately available for consultation/collaboration.  Additionally, I saw the patient independently, verified the history, examined the patient and discussed the treatment plan with the resident.  Leslee Home, M.D.    Reuben Likes, MD 04/17/13 [phone]

## 2014-11-01 ENCOUNTER — Other Ambulatory Visit: Payer: Self-pay | Admitting: Occupational Medicine

## 2014-11-01 ENCOUNTER — Ambulatory Visit: Payer: Self-pay

## 2014-11-01 DIAGNOSIS — M25522 Pain in left elbow: Secondary | ICD-10-CM

## 2014-11-01 DIAGNOSIS — M25571 Pain in right ankle and joints of right foot: Secondary | ICD-10-CM

## 2015-12-11 ENCOUNTER — Other Ambulatory Visit: Payer: Self-pay | Admitting: Family Medicine

## 2015-12-11 ENCOUNTER — Ambulatory Visit
Admission: RE | Admit: 2015-12-11 | Discharge: 2015-12-11 | Disposition: A | Discharge status: Home / Self Care | Payer: BC Managed Care – PPO | Source: Ambulatory Visit | Attending: Family Medicine | Admitting: Family Medicine

## 2015-12-11 DIAGNOSIS — R059 Cough, unspecified: Secondary | ICD-10-CM

## 2015-12-11 DIAGNOSIS — R05 Cough: Secondary | ICD-10-CM

## 2015-12-20 ENCOUNTER — Emergency Department (HOSPITAL_COMMUNITY): Payer: BC Managed Care – PPO

## 2015-12-20 ENCOUNTER — Encounter (HOSPITAL_COMMUNITY): Payer: Self-pay | Admitting: Emergency Medicine

## 2015-12-20 ENCOUNTER — Emergency Department (HOSPITAL_COMMUNITY)
Admission: EM | Admit: 2015-12-20 | Discharge: 2015-12-20 | Disposition: A | Discharge status: Home / Self Care | Payer: BC Managed Care – PPO | Attending: Emergency Medicine | Admitting: Emergency Medicine

## 2015-12-20 DIAGNOSIS — J40 Bronchitis, not specified as acute or chronic: Secondary | ICD-10-CM

## 2015-12-20 DIAGNOSIS — Z7952 Long term (current) use of systemic steroids: Secondary | ICD-10-CM | POA: Diagnosis not present

## 2015-12-20 DIAGNOSIS — J209 Acute bronchitis, unspecified: Secondary | ICD-10-CM | POA: Insufficient documentation

## 2015-12-20 DIAGNOSIS — Z8701 Personal history of pneumonia (recurrent): Secondary | ICD-10-CM | POA: Diagnosis not present

## 2015-12-20 DIAGNOSIS — R05 Cough: Secondary | ICD-10-CM | POA: Diagnosis present

## 2015-12-20 MED ORDER — AZITHROMYCIN 250 MG PO TABS: 250.0000 mg | ORAL_TABLET | Freq: Every day | ORAL | Status: DC

## 2015-12-20 MED ORDER — ALBUTEROL SULFATE HFA 108 (90 BASE) MCG/ACT IN AERS: 2.0000 | INHALATION_SPRAY | RESPIRATORY_TRACT | Status: DC | PRN

## 2015-12-20 MED ORDER — HYDROCOD POLST-CPM POLST ER 10-8 MG/5ML PO SUER: 5.0000 mL | Freq: Every evening | ORAL | Status: DC | PRN

## 2015-12-20 MED ORDER — BENZONATATE 100 MG PO CAPS: 100.0000 mg | ORAL_CAPSULE | Freq: Three times a day (TID) | ORAL | Status: DC | PRN

## 2015-12-20 NOTE — ED Notes (Signed)
Pt c/o productive cough onset 12/20. Pt has been seen at the urgent care center and was told she had pneumonia. Pt reports that she got better but recently symptoms returned. Pt seen by PMD and was told she had a viral URI and given prescriptions.

## 2015-12-20 NOTE — ED Provider Notes (Signed)
CSN: [id number]     Arrival date & time 12/20/15  1159 History  By signing my name below, I, Irene Pap, attest that this documentation has been prepared under the direction and in the presence of Gloriann Loan, PA-C. Electronically Signed: Irene Pap, ED Scribe. 12/20/2015. 1:21 PM.   Chief Complaint  Patient presents with  . Cough   The history is provided by the patient. No language interpreter was used.  HPI Comments: [name] is a 60 y.o. female with a hx of sinus infections and pneumonia who presents to the Emergency Department complaining of recurrent productive cough with green sputum and SOB onset one month ago, worsening two weeks ago. Pt states that the cough began on 11/11/15; she followed up with her PCP the next day and was diagnosed with pneumonia. She states that she was given medication for treatment. She then improved.  She states that the symptoms returned a couple of weeks ago, and was seen by her PCP, diagnosed with a viral URI, and prescribed prednisone. Pt states that she has almost finished the prednisone but the cough has not relieved. She reports associated fever tmax 103 F, chest pain that occurs with cough, rhinorrhea, maxillofacial pain, wheezing, and SOB. She called her PCP who told her to go the ED. Pt has been taking Aleve for the fever with relief. She states that she has multiple sick contacts at work. She reports decreased sleep due to coughing fits.  Pt denies hx of smoking, nausea or vomiting. Denies neck stiffness, N/V, or abdominal pain.   History reviewed. No pertinent past medical history. Past Surgical History  Procedure Laterality Date  . Cholecystectomy    . Abdominal hysterectomy     No family history on file. Social History  Substance Use Topics  . Smoking status: Never Smoker   . Smokeless tobacco: None  . Alcohol Use: Yes   OB History    No data available     Review of Systems  Constitutional: Positive for fever.  HENT:  Positive for rhinorrhea.   Respiratory: Positive for cough, shortness of breath and wheezing.   Gastrointestinal: Negative for nausea and vomiting.  All other systems reviewed and are negative.  Allergies  Sulfur  Home Medications   Prior to Admission medications   Medication Sig Start Date End Date Taking? Authorizing Provider  albuterol (PROVENTIL HFA;VENTOLIN HFA) 108 (90 Base) MCG/ACT inhaler Inhale 2 puffs into the lungs every 4 (four) hours as needed for wheezing or shortness of breath. 12/20/15   Gloriann Loan, PA-C  azithromycin (ZITHROMAX) 250 MG tablet Take 1 tablet (250 mg total) by mouth daily. Take first 2 tablets together, then 1 every day until finished. 12/20/15   Gloriann Loan, PA-C  benzonatate (TESSALON) 100 MG capsule Take 1 capsule (100 mg total) by mouth 3 (three) times daily as needed for cough. 12/20/15   Gloriann Loan, PA-C  chlorpheniramine-HYDROcodone (TUSSIONEX PENNKINETIC ER) 10-8 MG/5ML SUER Take 5 mLs by mouth at bedtime as needed for cough. 12/20/15   Gloriann Loan, PA-C  DiphenhydrAMINE HCl (BENADRYL PO) Take by mouth.    Historical Provider, MD  hydrOXYzine (ATARAX/VISTARIL) 10 MG tablet Take 1 tablet (10 mg total) by mouth 3 (three) times daily as needed for itching. 04/17/13   Melony Overly, MD  Loratadine (CLARITIN PO) Take by mouth.    Historical Provider, MD  triamcinolone cream (KENALOG) 0.1 % Apply topically 2 (two) times daily. 04/17/13   Melony Overly, MD   BP  118/74 mmHg  Pulse 98  Temp(Src) 98.9 F (37.2 C) (Oral)  Resp 20  SpO2 97% Physical Exam  Constitutional: She is oriented to person, place, and time. She appears well-developed and well-nourished.  Non-toxic appearance. She does not have a sickly appearance. She does not appear ill.  HENT:  Head: Normocephalic and atraumatic.  Mouth/Throat: Oropharynx is clear and moist.  Eyes: Conjunctivae are normal. Pupils are equal, round, and reactive to light.  Neck: Normal range of motion. Neck supple.   Cardiovascular: Normal rate, regular rhythm and normal heart sounds.   No murmur heard. Pulmonary/Chest: Effort normal. No accessory muscle usage or stridor. No respiratory distress. She has wheezes. She has no rhonchi. She has no rales.  Patient able to speak in clear full sentences without difficulty. No signs of respiratory distress.  Oxygen saturation 95% on RA.  Abdominal: Soft. Bowel sounds are normal. She exhibits no distension. There is no tenderness.  Musculoskeletal: Normal range of motion.  Lymphadenopathy:    She has no cervical adenopathy.  Neurological: She is alert and oriented to person, place, and time.  Speech clear without dysarthria.  Skin: Skin is warm and dry.  Psychiatric: She has a normal mood and affect. Her behavior is normal.    ED Course  Procedures (including critical care time) DIAGNOSTIC STUDIES: Oxygen Saturation is 95% on RA, adequate by my interpretation.    COORDINATION OF CARE: 1:21 PM-Discussed treatment plan which includes x-ray and anti-biotics with pt at bedside and pt agreed to plan.    Labs Review Labs Reviewed - No data to display  Imaging Review Dg Chest 2 View  12/20/2015  CLINICAL DATA:  Cough and shortness of breath for 2 months and fever for 2 days. EXAM: CHEST  2 VIEW COMPARISON:  12/11/2015 FINDINGS: The cardiomediastinal silhouette is unremarkable. There is no evidence of focal airspace disease, pulmonary edema, suspicious pulmonary nodule/mass, pleural effusion, or pneumothorax. No acute bony abnormalities are identified. IMPRESSION: No active cardiopulmonary disease. Electronically Signed   By: Margarette Canada M.D.   On: 12/20/2015 13:09   I have personally reviewed and evaluated these images and lab results as part of my medical decision-making.   EKG Interpretation None      MDM  Pt CXR negative for acute infiltrate. Patients symptoms are consistent with bronchitis. Patient appears well, non-toxic, or septic appearing.   Afebrile.  No signs of respiratory distress or hypoxia. Oxygen saturation 97% on room air. Patient will be treated with azithromycin, albuterol inhaler, tessalon perle, and tussionex.  Pt will be discharged with symptomatic treatment.  Verbalizes understanding and is agreeable with plan. Pt is hemodynamically stable & in NAD prior to dc.  Final diagnoses:  Bronchitis    I personally performed the services described in this documentation, which was scribed in my presence. The recorded information has been reviewed and is accurate.    Gloriann Loan, PA-C 12/20/15 1343  Charlesetta Shanks, MD 12/27/15 2156

## 2015-12-20 NOTE — Discharge Instructions (Signed)

## 2015-12-20 NOTE — ED Notes (Signed)
Declined W/C at D/C and was escorted to lobby by RN. 

## 2016-08-06 ENCOUNTER — Emergency Department (HOSPITAL_COMMUNITY): Payer: BC Managed Care – PPO

## 2016-08-06 ENCOUNTER — Ambulatory Visit (HOSPITAL_COMMUNITY)
Admission: EM | Admit: 2016-08-06 | Discharge: 2016-08-06 | Disposition: A | Discharge status: Nursing Home (Includes ICF) | Payer: BC Managed Care – PPO | Attending: Family Medicine | Admitting: Family Medicine

## 2016-08-06 ENCOUNTER — Encounter (HOSPITAL_COMMUNITY): Payer: Self-pay | Admitting: *Deleted

## 2016-08-06 ENCOUNTER — Emergency Department (HOSPITAL_COMMUNITY)
Admission: EM | Admit: 2016-08-06 | Discharge: 2016-08-06 | Disposition: A | Discharge status: Home / Self Care | Payer: BC Managed Care – PPO | Attending: Emergency Medicine | Admitting: Emergency Medicine

## 2016-08-06 DIAGNOSIS — N342 Other urethritis: Secondary | ICD-10-CM

## 2016-08-06 DIAGNOSIS — Z8543 Personal history of malignant neoplasm of ovary: Secondary | ICD-10-CM | POA: Diagnosis not present

## 2016-08-06 DIAGNOSIS — R1011 Right upper quadrant pain: Secondary | ICD-10-CM | POA: Diagnosis present

## 2016-08-06 DIAGNOSIS — R030 Elevated blood-pressure reading, without diagnosis of hypertension: Secondary | ICD-10-CM

## 2016-08-06 DIAGNOSIS — R1031 Right lower quadrant pain: Secondary | ICD-10-CM | POA: Diagnosis not present

## 2016-08-06 DIAGNOSIS — IMO0001 Reserved for inherently not codable concepts without codable children: Secondary | ICD-10-CM

## 2016-08-06 LAB — COMPREHENSIVE METABOLIC PANEL
ALBUMIN: 4.3 g/dL (ref 3.5–5.0)
ALT: 23 U/L (ref 14–54)
AST: 20 U/L (ref 15–41)
Alkaline Phosphatase: 77 U/L (ref 38–126)
Anion gap: 9 (ref 5–15)
BUN: 13 mg/dL (ref 6–20)
CHLORIDE: 98 mmol/L — AB (ref 101–111)
CO2: 29 mmol/L (ref 22–32)
CREATININE: 0.73 mg/dL (ref 0.44–1.00)
Calcium: 9.7 mg/dL (ref 8.9–10.3)
GFR calc Af Amer: >60 mL/min (ref >60)
GFR calc non Af Amer: >60 mL/min (ref >60)
GLUCOSE: 93 mg/dL (ref 65–99)
Potassium: 4 mmol/L (ref 3.5–5.1)
Sodium: 136 mmol/L (ref 135–145)
Total Bilirubin: 0.4 mg/dL (ref 0.3–1.2)
Total Protein: 7.1 g/dL (ref 6.5–8.1)

## 2016-08-06 LAB — POCT I-STAT, CHEM 8
BUN: 17 mg/dL (ref 6–20)
CHLORIDE: 97 mmol/L — AB (ref 101–111)
Calcium, Ion: 1.19 mmol/L (ref 1.15–1.40)
Creatinine, Ser: 0.7 mg/dL (ref 0.44–1.00)
Glucose, Bld: 96 mg/dL (ref 65–99)
HEMATOCRIT: 40 % (ref 36.0–46.0)
Hemoglobin: 13.6 g/dL (ref 12.0–15.0)
Potassium: 3.9 mmol/L (ref 3.5–5.1)
SODIUM: 135 mmol/L (ref 135–145)
TCO2: 28 mmol/L (ref 0–100)

## 2016-08-06 LAB — CBC
HEMATOCRIT: 40.4 % (ref 36.0–46.0)
Hemoglobin: 13.3 g/dL (ref 12.0–15.0)
MCH: 29.9 pg (ref 26.0–34.0)
MCHC: 32.9 g/dL (ref 30.0–36.0)
MCV: 90.8 fL (ref 78.0–100.0)
PLATELETS: 442 10*3/uL — AB (ref 150–400)
RBC: 4.45 MIL/uL (ref 3.87–5.11)
RDW: 12.4 % (ref 11.5–15.5)
WBC: 10.5 10*3/uL (ref 4.0–10.5)

## 2016-08-06 LAB — URINALYSIS, ROUTINE W REFLEX MICROSCOPIC
Bilirubin Urine: NEGATIVE
GLUCOSE, UA: NEGATIVE mg/dL
Hgb urine dipstick: NEGATIVE
Ketones, ur: NEGATIVE mg/dL
Nitrite: NEGATIVE
PROTEIN: NEGATIVE mg/dL
Specific Gravity, Urine: 1.006 (ref 1.005–1.030)
pH: 6.5 (ref 5.0–8.0)

## 2016-08-06 LAB — URINE MICROSCOPIC-ADD ON: RBC / HPF: NONE SEEN RBC/hpf (ref 0–5)

## 2016-08-06 LAB — LIPASE, BLOOD: Lipase: 23 U/L (ref 11–51)

## 2016-08-06 LAB — TROPONIN I

## 2016-08-06 MED ORDER — HYDROCODONE-ACETAMINOPHEN 5-325 MG PO TABS
1.0000 | ORAL_TABLET | Freq: Once | ORAL | Status: AC
  Administered 2016-08-06: 1 via ORAL
  Filled 2016-08-06: qty 1

## 2016-08-06 MED ORDER — HYDROCODONE-ACETAMINOPHEN 5-325 MG PO TABS: ORAL_TABLET | ORAL | 0 refills | Status: DC

## 2016-08-06 MED ORDER — ONDANSETRON 4 MG PO TBDP
8.0000 mg | ORAL_TABLET | Freq: Once | ORAL | Status: DC
  Filled 2016-08-06: qty 2

## 2016-08-06 NOTE — Discharge Instructions (Signed)
For pain control please take ibuprofen (also known as Motrin or Advil) 800mg  (this is normally 4 over the counter pills) 3 times a day  for 5 days. Take with food to minimize stomach irritation.  Take vicodin for breakthrough pain, do not drink alcohol, drive, care for children or do other critical tasks while taking vicodin.  Please follow with your primary care doctor in the next 5 days for high blood pressure evaluation. If you do not have a primary care doctor, present to urgent care. Reduce salt intake. Seek emergency medical care for unilateral weakness, slurring, change in vision, or chest pain and shortness of breath.  Please follow with your primary care doctor in the next 2 days for a check-up. They must obtain records for further management.   Do not hesitate to return to the Emergency Department for any new, worsening or concerning symptoms.

## 2016-08-06 NOTE — ED Triage Notes (Signed)
Pt   Had  Symptoms    Of    Rash     That  Are  Better       Her  Blood  Pressure  Has  Been  Elevated  At  Times    Off  And  On    And  She  Has  What  She  Describes  As  A  Nagging  Pain in her  ruq      She  Has  Had  A  History  Of  Gallbladder   Surgery    In past   She  denys  Any  nausea  Or  Vomiting

## 2016-08-06 NOTE — ED Triage Notes (Addendum)
Has been on hydroxizine and prednisone for a rash and now she has an elevated bp went to [facility] and sent here for further eval also having rt sided abd pain off aND ON  for a month, NO N/V/D , states abd pain was not as bad  untill she started taking the meds, she called the dr and they took her off both meds on Tuesday, denies n/v/d but has not had bm today

## 2016-08-06 NOTE — ED Notes (Signed)
Per UC pt has had HTN this week while taking a dose of 50 mg prednisone a day for a rash.  Rash is resolving.  Last BP with UC was 166/98.  No hx HTN.

## 2016-08-06 NOTE — ED Provider Notes (Signed)
Patient seen/examined in the Emergency Department in conjunction with Midlevel Provider Patient reports RUQ abd pain, worsened since new medications were initiated Exam : awake/alert, no distress, mild RUQ tenderness, no other focal tenderness, she is resting comfortably Plan: labs pending at this time.  I would defer further imaging for now     Ripley Fraise, MD 08/06/16 [phone]

## 2016-08-06 NOTE — ED Provider Notes (Addendum)
[place]    CSN: [id number] Arrival date & time: 08/06/16  1110  First Provider Contact:  First MD Initiated Contact with Patient 08/06/16 1153        History   Chief Complaint Chief Complaint  Patient presents with  . Hypertension    HPI [name] is a 60 y.o. female.    Hypertension  This is a new problem. The current episode started more than 2 days ago (started on pred and vistaril ofr hives sev days ago, hbp and edema resulted). The problem has been gradually worsening. Associated symptoms include abdominal pain. Pertinent negatives include no chest pain and no shortness of breath.    No past medical history on file.  Patient Active Problem List   Diagnosis Date Noted  . NEOPLASM, MALIGNANT, OVARY 02/05/2008  . ALLERGY 02/05/2008    Past Surgical History:  Procedure Laterality Date  . ABDOMINAL HYSTERECTOMY    . CHOLECYSTECTOMY      OB History    No data available       Home Medications    Prior to Admission medications   Medication Sig Start Date End Date Taking? Authorizing Provider  albuterol (PROVENTIL HFA;VENTOLIN HFA) 108 (90 Base) MCG/ACT inhaler Inhale 2 puffs into the lungs every 4 (four) hours as needed for wheezing or shortness of breath. 12/20/15   Gloriann Loan, PA-C  azithromycin (ZITHROMAX) 250 MG tablet Take 1 tablet (250 mg total) by mouth daily. Take first 2 tablets together, then 1 every day until finished. 12/20/15   Gloriann Loan, PA-C  benzonatate (TESSALON) 100 MG capsule Take 1 capsule (100 mg total) by mouth 3 (three) times daily as needed for cough. 12/20/15   Gloriann Loan, PA-C  chlorpheniramine-HYDROcodone (TUSSIONEX PENNKINETIC ER) 10-8 MG/5ML SUER Take 5 mLs by mouth at bedtime as needed for cough. 12/20/15   Gloriann Loan, PA-C  DiphenhydrAMINE HCl (BENADRYL PO) Take by mouth.    Historical Provider, MD  hydrOXYzine (ATARAX/VISTARIL) 10 MG tablet Take 1 tablet (10 mg total) by mouth 3 (three) times daily as needed for  itching. 04/17/13   Melony Overly, MD  Loratadine (CLARITIN PO) Take by mouth.    Historical Provider, MD  triamcinolone cream (KENALOG) 0.1 % Apply topically 2 (two) times daily. 04/17/13   Melony Overly, MD    Family History No family history on file.  Social History Social History  Substance Use Topics  . Smoking status: Never Smoker  . Smokeless tobacco: Not on file  . Alcohol use Yes     Allergies   Sulfur   Review of Systems Review of Systems  Constitutional: Negative for fever.  HENT: Negative.   Respiratory: Negative for shortness of breath.   Cardiovascular: Positive for leg swelling. Negative for chest pain.  Gastrointestinal: Positive for abdominal pain. Negative for nausea and vomiting.  Genitourinary: Negative.   All other systems reviewed and are negative.    Physical Exam Triage Vital Signs ED Triage Vitals  Enc Vitals Group     BP      Pulse      Resp      Temp      Temp src      SpO2      Weight      Height      Head Circumference      Peak Flow      Pain Score      Pain Loc      Pain Edu?  Excl. in [place]?    No data found.   Updated Vital Signs There were no vitals taken for this visit.  Visual Acuity Right Eye Distance:   Left Eye Distance:   Bilateral Distance:    Right Eye Near:   Left Eye Near:    Bilateral Near:     Physical Exam  Constitutional: She is oriented to person, place, and time. She appears well-developed and well-nourished. No distress.  Eyes: EOM are normal. Pupils are equal, round, and reactive to light.  Cardiovascular: Normal rate, regular rhythm, normal heart sounds and intact distal pulses.   Pulmonary/Chest: Effort normal and breath sounds normal.  Abdominal: She exhibits no mass. There is tenderness. There is no rebound and no guarding. No hernia.  Musculoskeletal: She exhibits edema.  Neurological: She is alert and oriented to person, place, and time.  Skin: Skin is warm and dry. No rash noted.    Nursing note and vitals reviewed.    UC Treatments / Results  Labs (all labs ordered are listed, but only abnormal results are displayed) Labs Reviewed - No data to display I-stat wnl.  EKG  EKG Interpretation None       Radiology No results found.  Procedures Procedures (including critical care time)  Medications Ordered in UC Medications - No data to display   Initial Impression / Assessment and Plan / UC Course  I have reviewed the triage vital signs and the nursing notes.  Pertinent labs & imaging results that were available during my care of the patient were reviewed by me and considered in my medical decision making (see chart for details).  Clinical Course   Sent for eval of possible renovasc etiol of recent hbp and ruq pain.  Final Clinical Impressions(s) / UC Diagnoses   Final diagnoses:  None    New Prescriptions New Prescriptions   No medications on file     Billy Fischer, MD 08/06/16 [place], MD 08/06/16 1207

## 2016-08-06 NOTE — ED Notes (Signed)
Pt left for US

## 2016-08-06 NOTE — ED Notes (Signed)
Phlebotomy to get blood work

## 2016-08-06 NOTE — ED Provider Notes (Signed)
[facility] DEPT Provider Note   CSN: [id number] Arrival date & time: 08/06/16  1250     History   Chief Complaint Chief Complaint  Patient presents with  . Hypertension  . Abdominal Pain    HPI  Blood pressure 142/82, pulse 86, temperature 98.4 F (36.9 C), temperature source Oral, resp. rate 16, SpO2 97 %.  [name] is a 60 y.o. female  sent from urgent care for evaluation of elevated blood pressure and right upper quadrant abdominal pain. Patient states that she doesn't have a diagnosis of hypertension in fact, she states that her blood pressure is normally low. She states that she has been checking it at home it has been consistently elevated around 150 over 90s. States that she has had right upper quadrant pain intermittently over the course of the last month it increased significantly over the last 2 days. States that it is colicky, she denies nausea, vomiting, fever, chills, decreased by mouth intake, change in bowel or bladder habits, chest pain, shortness of breath or cough. Pain is severe, it's alleviated when she puts her hand in the right upper quadrant, it is not exacerbated postprandially. No pain medication is taken prior to arrival.  She states that she recently started taking Vistaril and prednisone for hives to her chest, no other symptoms. She thinks that these medications may be making her right upper quadrant pain worse because it worsened when she started taking them. She hasn't had these medications in about 2 days, the hives to the upper chest have resolved she has no shortness of breath, nausea vomiting, lip or tongue swelling, shortness of breath or wheezing.   HPI  No past medical history on file.  Patient Active Problem List   Diagnosis Date Noted  . NEOPLASM, MALIGNANT, OVARY 02/05/2008  . ALLERGY 02/05/2008    Past Surgical History:  Procedure Laterality Date  . ABDOMINAL HYSTERECTOMY    . CHOLECYSTECTOMY      OB History    No data  available       Home Medications    Prior to Admission medications   Medication Sig Start Date End Date Taking? Authorizing Provider  albuterol (PROVENTIL HFA;VENTOLIN HFA) 108 (90 Base) MCG/ACT inhaler Inhale 2 puffs into the lungs every 4 (four) hours as needed for wheezing or shortness of breath. 12/20/15   Gloriann Loan, PA-C  azithromycin (ZITHROMAX) 250 MG tablet Take 1 tablet (250 mg total) by mouth daily. Take first 2 tablets together, then 1 every day until finished. 12/20/15   Gloriann Loan, PA-C  benzonatate (TESSALON) 100 MG capsule Take 1 capsule (100 mg total) by mouth 3 (three) times daily as needed for cough. 12/20/15   Gloriann Loan, PA-C  chlorpheniramine-HYDROcodone (TUSSIONEX PENNKINETIC ER) 10-8 MG/5ML SUER Take 5 mLs by mouth at bedtime as needed for cough. 12/20/15   Gloriann Loan, PA-C  DiphenhydrAMINE HCl (BENADRYL PO) Take by mouth.    Historical Provider, MD  HYDROcodone-acetaminophen (NORCO/VICODIN) 5-325 MG tablet Take 1-2 tablets by mouth every 6 hours as needed for pain and/or cough. 08/06/16   Myka Hitz, PA-C  hydrOXYzine (ATARAX/VISTARIL) 10 MG tablet Take 1 tablet (10 mg total) by mouth 3 (three) times daily as needed for itching. 04/17/13   Melony Overly, MD  Loratadine (CLARITIN PO) Take by mouth.    Historical Provider, MD  triamcinolone cream (KENALOG) 0.1 % Apply topically 2 (two) times daily. 04/17/13   Melony Overly, MD    Family History No family history  on file.  Social History Social History  Substance Use Topics  . Smoking status: Never Smoker  . Smokeless tobacco: Not on file  . Alcohol use Yes     Allergies   Sulfur   Review of Systems Review of Systems  10 systems reviewed and found to be negative, except as noted in the HPI.   Physical Exam Updated Vital Signs BP 140/81   Pulse 98   Temp 98 F (36.7 C) (Oral)   Resp 14   SpO2 96%   Physical Exam  Constitutional: She is oriented to person, place, and time. She appears  well-developed and well-nourished. No distress.  Appears uncomfortable, holding right upper quadrant  HENT:  Head: Normocephalic and atraumatic.  Mouth/Throat: Oropharynx is clear and moist.  Eyes: Conjunctivae and EOM are normal. Pupils are equal, round, and reactive to light.  Neck: Normal range of motion.  Cardiovascular: Normal rate, regular rhythm and intact distal pulses.   Pulmonary/Chest: Effort normal and breath sounds normal.  Abdominal: Soft. She exhibits no distension and no mass. There is no tenderness. There is no rebound and no guarding. No hernia.  Not exacerbated by deep palpation, Murphy sign negative, no tenderness to deep palpation of any quadrant.  No CVA tenderness to percussion.  Musculoskeletal: Normal range of motion.  Neurological: She is alert and oriented to person, place, and time.  Skin: She is not diaphoretic.  Psychiatric: She has a normal mood and affect.  Nursing note and vitals reviewed.    ED Treatments / Results  Labs (all labs ordered are listed, but only abnormal results are displayed) Labs Reviewed  CBC - Abnormal; Notable for the following:       Result Value   Platelets 442 (*)    All other components within normal limits  URINALYSIS, ROUTINE W REFLEX MICROSCOPIC (NOT AT [facility]) - Abnormal; Notable for the following:    Leukocytes, UA TRACE (*)    All other components within normal limits  COMPREHENSIVE METABOLIC PANEL - Abnormal; Notable for the following:    Chloride 98 (*)    All other components within normal limits  URINE MICROSCOPIC-ADD ON - Abnormal; Notable for the following:    Squamous Epithelial / LPF 0-5 (*)    Bacteria, UA RARE (*)    All other components within normal limits  LIPASE, BLOOD  TROPONIN I    EKG  EKG Interpretation  Date/Time:  Friday August 06 2016 16:57:56 EDT Ventricular Rate:  85 PR Interval:    QRS Duration: 92 QT Interval:  366 QTC Calculation: 436 R Axis:   16 Text Interpretation:  Sinus  rhythm Normal ECG No significant change since last tracing Confirmed by Manvel ([id number]) on 08/06/2016 5:11:26 PM       Radiology Dg Chest 2 View  Result Date: 08/06/2016 CLINICAL DATA:  Chest pain. EXAM: CHEST  2 VIEW COMPARISON:  Radiographs of December 20, 2015. FINDINGS: The heart size and mediastinal contours are within normal limits. Both lungs are clear. No pneumothorax or pleural effusion is noted. The visualized skeletal structures are unremarkable. IMPRESSION: No active cardiopulmonary disease. Electronically Signed   By: Marijo Conception, M.D.   On: 08/06/2016 16:21   US Abdomen Limited Ruq  Result Date: 08/06/2016 CLINICAL DATA:  60 year old female with right upper quadrant abdominal pain for 1 year. History of cholecystectomy. EXAM: US ABDOMEN LIMITED - RIGHT UPPER QUADRANT COMPARISON:  03/26/2009 ultrasound. FINDINGS: Gallbladder: Not visualized compatible with cholecystectomy. Common bile duct:  Diameter: 4 mm. There is no evidence of extrahepatic or intrahepatic biliary dilatation. Liver: Slightly increased echogenicity which may represent steatosis. No focal hepatic abnormality noted. No free fluid in the right upper abdomen identified. IMPRESSION: No evidence of acute abnormality. No evidence of biliary dilatation. Slightly increased hepatic echogenicity which may represent hepatic steatosis. Status post cholecystectomy. Electronically Signed   By: Margarette Canada M.D.   On: 08/06/2016 16:05    Procedures Procedures (including critical care time)  Medications Ordered in ED Medications  ondansetron (ZOFRAN-ODT) disintegrating tablet 8 mg (8 mg Oral Not Given 08/06/16 1743)  HYDROcodone-acetaminophen (NORCO/VICODIN) 5-325 MG per tablet 1 tablet (1 tablet Oral Given 08/06/16 1743)     Initial Impression / Assessment and Plan / ED Course  I have reviewed the triage vital signs and the nursing notes.  Pertinent labs & imaging results that were available during my care of  the patient were reviewed by me and considered in my medical decision making (see chart for details).  Clinical Course    Vitals:   08/06/16 1730 08/06/16 1800 08/06/16 1830 08/06/16 1837  BP: 154/90 (!) 149/105 140/81   Pulse: 89 107 98   Resp: 11 23 14    Temp:    98 F (36.7 C)  TempSrc:    Oral  SpO2: 100% 97% 96%     Medications  ondansetron (ZOFRAN-ODT) disintegrating tablet 8 mg (8 mg Oral Not Given 08/06/16 1743)  HYDROcodone-acetaminophen (NORCO/VICODIN) 5-325 MG per tablet 1 tablet (1 tablet Oral Given 08/06/16 1743)    ALIZZA BOPP is 60 y.o. female presenting with Elevated blood pressure right upper quadrant abdominal pain, worsening over the last 2 days. She doesn't have any history of hypertension. Abdominal exam is nonsurgical. Blood work including troponin with no abnormality, EKG without acute changes. Ultrasound shows fatty liver.  Repeat abdominal exam remains nonsurgical, shared visit with attending who agrees she is stable for discharge to home and follow-up with PCP.I think that her elevated blood pressure is likely secondary to her pain.  On discussion at discharge patient remains in severe pain, tearful and concerned that the source of her discomfort is not elucidated. I've had a extensive discussion with her and offered her imaging via CAT scan to further evaluate the pain. Patient would like to have MRI but as explained to her that that is not possible in the ED. I have tried to communicate the CAT scan is in excellent imaging modality and she has expressed concern that it will not be covered by her insurance, will explained to her that I can't guarantee her insurance will cover it, I think is a reasonable test and should be reimbursed appropriately. We've had an extensive discussion about the possibilities of further testing and further pain control in the ED, we've had a discussion of observation admission for pain control. Patient states that she would like to be  discharged home. I've encouraged her to follow closely with her primary care however, she expresses concern because she cannot get in to see her PCP in a timely fashion. Encouraged her to make and keep the appointment with her primary care physician even if it may be several weeks out, patient is given referral to gastroenterology, we've had an extensive discussion of return precautions and patient is invited to return to the ED for any new symptoms.  Evaluation does not show pathology that would require ongoing emergent intervention or inpatient treatment. Pt is hemodynamically stable and mentating appropriately. Discussed findings and plan  with patient/guardian, who agrees with care plan. All questions answered. Return precautions discussed and outpatient follow up given.      Final Clinical Impressions(s) / ED Diagnoses   Final diagnoses:  RUQ pain  Elevated blood pressure    New Prescriptions Discharge Medication List as of 08/06/2016  6:24 PM    START taking these medications   Details  HYDROcodone-acetaminophen (NORCO/VICODIN) 5-325 MG tablet Take 1-2 tablets by mouth every 6 hours as needed for pain and/or cough., Print         Monico Blitz, PA-C 08/06/16 1921    Ripley Fraise, MD 08/06/16 [id number]    Ripley Fraise, MD 08/06/16 [id number]

## 2016-08-06 NOTE — ED Triage Notes (Signed)
Pt  Reports     She   Was   Seen   For      A  Rash   5  Days        Was  Placed  On  Prednisone         And       Vistaril

## 2016-08-06 NOTE — ED Notes (Signed)
Report  Phoned  To   angela

## 2016-08-06 NOTE — ED Notes (Signed)
Provider at the bedside.  

## 2016-08-06 NOTE — ED Notes (Signed)
Pt returned from [place]. Pt ambulated to the bathroom independently

## 2016-08-09 ENCOUNTER — Encounter: Payer: Self-pay | Admitting: Gastroenterology

## 2016-10-19 ENCOUNTER — Ambulatory Visit: Payer: Self-pay | Admitting: Gastroenterology

## 2016-11-26 ENCOUNTER — Other Ambulatory Visit: Payer: Self-pay | Admitting: Occupational Medicine

## 2016-11-26 ENCOUNTER — Ambulatory Visit: Payer: Self-pay

## 2016-11-26 DIAGNOSIS — M542 Cervicalgia: Secondary | ICD-10-CM

## 2019-02-02 ENCOUNTER — Other Ambulatory Visit: Payer: Self-pay

## 2019-02-02 ENCOUNTER — Emergency Department (HOSPITAL_COMMUNITY): Payer: BC Managed Care – PPO

## 2019-02-02 ENCOUNTER — Inpatient Hospital Stay (HOSPITAL_COMMUNITY)
Admission: EM | Admit: 2019-02-02 | Discharge: 2019-02-07 | DRG: 190 | Disposition: A | Discharge status: Home / Self Care | Payer: BC Managed Care – PPO | Attending: Family Medicine | Admitting: Family Medicine

## 2019-02-02 ENCOUNTER — Encounter (HOSPITAL_COMMUNITY): Payer: Self-pay

## 2019-02-02 DIAGNOSIS — L209 Atopic dermatitis, unspecified: Secondary | ICD-10-CM | POA: Diagnosis present

## 2019-02-02 DIAGNOSIS — Z87410 Personal history of cervical dysplasia: Secondary | ICD-10-CM

## 2019-02-02 DIAGNOSIS — R739 Hyperglycemia, unspecified: Secondary | ICD-10-CM | POA: Diagnosis not present

## 2019-02-02 DIAGNOSIS — J40 Bronchitis, not specified as acute or chronic: Secondary | ICD-10-CM | POA: Insufficient documentation

## 2019-02-02 DIAGNOSIS — T380X5A Adverse effect of glucocorticoids and synthetic analogues, initial encounter: Secondary | ICD-10-CM | POA: Diagnosis not present

## 2019-02-02 DIAGNOSIS — J441 Chronic obstructive pulmonary disease with (acute) exacerbation: Principal | ICD-10-CM | POA: Diagnosis present

## 2019-02-02 DIAGNOSIS — R0602 Shortness of breath: Secondary | ICD-10-CM | POA: Diagnosis not present

## 2019-02-02 DIAGNOSIS — Z882 Allergy status to sulfonamides status: Secondary | ICD-10-CM

## 2019-02-02 DIAGNOSIS — R0902 Hypoxemia: Secondary | ICD-10-CM | POA: Diagnosis not present

## 2019-02-02 DIAGNOSIS — Z90710 Acquired absence of both cervix and uterus: Secondary | ICD-10-CM

## 2019-02-02 DIAGNOSIS — Z91018 Allergy to other foods: Secondary | ICD-10-CM

## 2019-02-02 DIAGNOSIS — J069 Acute upper respiratory infection, unspecified: Secondary | ICD-10-CM | POA: Diagnosis present

## 2019-02-02 DIAGNOSIS — I10 Essential (primary) hypertension: Secondary | ICD-10-CM | POA: Diagnosis present

## 2019-02-02 DIAGNOSIS — J9601 Acute respiratory failure with hypoxia: Secondary | ICD-10-CM | POA: Diagnosis present

## 2019-02-02 DIAGNOSIS — B9789 Other viral agents as the cause of diseases classified elsewhere: Secondary | ICD-10-CM

## 2019-02-02 LAB — CBC WITH DIFFERENTIAL/PLATELET
Abs Immature Granulocytes: 0.03 10*3/uL (ref 0.00–0.07)
BASOS ABS: 0 10*3/uL (ref 0.0–0.1)
Basophils Relative: 1 %
EOS ABS: 0.7 10*3/uL — AB (ref 0.0–0.5)
Eosinophils Relative: 8 %
HCT: 36.4 % (ref 36.0–46.0)
HEMOGLOBIN: 12 g/dL (ref 12.0–15.0)
Immature Granulocytes: 0 %
LYMPHS PCT: 31 %
Lymphs Abs: 2.7 10*3/uL (ref 0.7–4.0)
MCH: 30.3 pg (ref 26.0–34.0)
MCHC: 33 g/dL (ref 30.0–36.0)
MCV: 91.9 fL (ref 80.0–100.0)
Monocytes Absolute: 0.5 10*3/uL (ref 0.1–1.0)
Monocytes Relative: 6 %
NRBC: 0 % (ref 0.0–0.2)
Neutro Abs: 4.7 10*3/uL (ref 1.7–7.7)
Neutrophils Relative %: 54 %
Platelets: 414 10*3/uL — ABNORMAL HIGH (ref 150–400)
RBC: 3.96 MIL/uL (ref 3.87–5.11)
RDW: 11.9 % (ref 11.5–15.5)
WBC: 8.8 10*3/uL (ref 4.0–10.5)

## 2019-02-02 LAB — LACTIC ACID, PLASMA
LACTIC ACID, VENOUS: 1.2 mmol/L (ref 0.5–1.9)
LACTIC ACID, VENOUS: 1.2 mmol/L (ref 0.5–1.9)

## 2019-02-02 LAB — COMPREHENSIVE METABOLIC PANEL
ALT: 27 U/L (ref 0–44)
AST: 22 U/L (ref 15–41)
Albumin: 3.8 g/dL (ref 3.5–5.0)
Alkaline Phosphatase: 66 U/L (ref 38–126)
Anion gap: 9 (ref 5–15)
BUN: 8 mg/dL (ref 6–20)
CHLORIDE: 101 mmol/L (ref 98–111)
CO2: 26 mmol/L (ref 22–32)
Calcium: 9.6 mg/dL (ref 8.9–10.3)
Creatinine, Ser: 0.7 mg/dL (ref 0.44–1.00)
Glucose, Bld: 145 mg/dL — ABNORMAL HIGH (ref 70–99)
Potassium: 3.9 mmol/L (ref 3.5–5.1)
Sodium: 136 mmol/L (ref 135–145)
Total Bilirubin: 0.6 mg/dL (ref 0.3–1.2)
Total Protein: 6.5 g/dL (ref 6.5–8.1)

## 2019-02-02 LAB — RESPIRATORY PANEL BY PCR
Adenovirus: NOT DETECTED
Bordetella pertussis: NOT DETECTED
CORONAVIRUS OC43-RVPPCR: NOT DETECTED
Chlamydophila pneumoniae: NOT DETECTED
Coronavirus 229E: NOT DETECTED
Coronavirus HKU1: NOT DETECTED
Coronavirus NL63: NOT DETECTED
Influenza A: NOT DETECTED
Influenza B: NOT DETECTED
MYCOPLASMA PNEUMONIAE-RVPPCR: NOT DETECTED
Metapneumovirus: NOT DETECTED
PARAINFLUENZA VIRUS 1-RVPPCR: NOT DETECTED
Parainfluenza Virus 2: NOT DETECTED
Parainfluenza Virus 3: NOT DETECTED
Parainfluenza Virus 4: NOT DETECTED
Respiratory Syncytial Virus: NOT DETECTED
Rhinovirus / Enterovirus: NOT DETECTED

## 2019-02-02 LAB — PROCALCITONIN: Procalcitonin: <0.1 ng/mL

## 2019-02-02 MED ORDER — SODIUM CHLORIDE 0.9 % IV SOLN
2.0000 g | Freq: Once | INTRAVENOUS | Status: AC
  Administered 2019-02-02: 2 g via INTRAVENOUS
  Filled 2019-02-02: qty 2

## 2019-02-02 MED ORDER — ADULT MULTIVITAMIN W/MINERALS CH
1.0000 | ORAL_TABLET | Freq: Every day | ORAL | Status: DC
  Administered 2019-02-03 – 2019-02-07 (×5): 1 via ORAL
  Filled 2019-02-02 (×5): qty 1

## 2019-02-02 MED ORDER — METHYLPREDNISOLONE SODIUM SUCC 125 MG IJ SOLR
60.0000 mg | Freq: Two times a day (BID) | INTRAMUSCULAR | Status: DC
  Administered 2019-02-02 – 2019-02-03 (×3): 60 mg via INTRAVENOUS
  Filled 2019-02-02 (×4): qty 2

## 2019-02-02 MED ORDER — LACTATED RINGERS IV BOLUS
1000.0000 mL | Freq: Once | INTRAVENOUS | Status: AC
  Administered 2019-02-02: 1000 mL via INTRAVENOUS

## 2019-02-02 MED ORDER — ACETAMINOPHEN 650 MG RE SUPP: 650.0000 mg | Freq: Four times a day (QID) | RECTAL | Status: DC | PRN

## 2019-02-02 MED ORDER — ALBUTEROL SULFATE (2.5 MG/3ML) 0.083% IN NEBU
5.0000 mg | INHALATION_SOLUTION | Freq: Once | RESPIRATORY_TRACT | Status: AC
  Administered 2019-02-02: 5 mg via RESPIRATORY_TRACT
  Filled 2019-02-02: qty 6

## 2019-02-02 MED ORDER — POLYETHYLENE GLYCOL 3350 17 G PO PACK
17.0000 g | PACK | Freq: Every day | ORAL | Status: DC
  Administered 2019-02-03 – 2019-02-06 (×5): 17 g via ORAL
  Filled 2019-02-02 (×7): qty 1

## 2019-02-02 MED ORDER — METHYLPREDNISOLONE SODIUM SUCC 125 MG IJ SOLR
125.0000 mg | Freq: Once | INTRAMUSCULAR | Status: AC
  Administered 2019-02-02: 125 mg via INTRAVENOUS
  Filled 2019-02-02: qty 2

## 2019-02-02 MED ORDER — ACETAMINOPHEN 325 MG PO TABS
650.0000 mg | ORAL_TABLET | Freq: Four times a day (QID) | ORAL | Status: DC | PRN
  Administered 2019-02-06: 650 mg via ORAL
  Filled 2019-02-02: qty 2

## 2019-02-02 MED ORDER — IPRATROPIUM-ALBUTEROL 0.5-2.5 (3) MG/3ML IN SOLN
3.0000 mL | RESPIRATORY_TRACT | Status: DC
  Administered 2019-02-02: 3 mL via RESPIRATORY_TRACT
  Filled 2019-02-02: qty 3

## 2019-02-02 MED ORDER — ALBUTEROL SULFATE (2.5 MG/3ML) 0.083% IN NEBU: 2.5000 mg | INHALATION_SOLUTION | RESPIRATORY_TRACT | Status: DC | PRN

## 2019-02-02 MED ORDER — ONDANSETRON HCL 4 MG PO TABS
4.0000 mg | ORAL_TABLET | Freq: Four times a day (QID) | ORAL | Status: DC | PRN
  Administered 2019-02-06: 4 mg via ORAL
  Filled 2019-02-02: qty 1

## 2019-02-02 MED ORDER — IPRATROPIUM-ALBUTEROL 0.5-2.5 (3) MG/3ML IN SOLN
3.0000 mL | RESPIRATORY_TRACT | Status: AC
  Administered 2019-02-02 (×3): 3 mL via RESPIRATORY_TRACT
  Filled 2019-02-02 (×3): qty 3

## 2019-02-02 MED ORDER — GUAIFENESIN-CODEINE 100-10 MG/5ML PO SOLN
5.0000 mL | ORAL | Status: DC | PRN
  Administered 2019-02-04 (×4): 5 mL via ORAL
  Filled 2019-02-02 (×5): qty 5

## 2019-02-02 MED ORDER — ONDANSETRON HCL 4 MG/2ML IJ SOLN: 4.0000 mg | Freq: Four times a day (QID) | INTRAMUSCULAR | Status: DC | PRN

## 2019-02-02 MED ORDER — IPRATROPIUM-ALBUTEROL 0.5-2.5 (3) MG/3ML IN SOLN
3.0000 mL | Freq: Three times a day (TID) | RESPIRATORY_TRACT | Status: DC
  Administered 2019-02-03: 3 mL via RESPIRATORY_TRACT
  Filled 2019-02-02 (×2): qty 3

## 2019-02-02 MED ORDER — BENZONATATE 100 MG PO CAPS
200.0000 mg | ORAL_CAPSULE | Freq: Two times a day (BID) | ORAL | Status: DC | PRN
  Administered 2019-02-06: 200 mg via ORAL
  Filled 2019-02-02 (×2): qty 2

## 2019-02-02 MED ORDER — SODIUM CHLORIDE 0.9% FLUSH: 3.0000 mL | Freq: Once | INTRAVENOUS | Status: DC

## 2019-02-02 MED ORDER — SODIUM CHLORIDE 0.9 % IV SOLN
1.0000 g | Freq: Three times a day (TID) | INTRAVENOUS | Status: DC
  Administered 2019-02-03 – 2019-02-04 (×3): 1 g via INTRAVENOUS
  Filled 2019-02-02 (×7): qty 1

## 2019-02-02 MED ORDER — ENOXAPARIN SODIUM 40 MG/0.4ML ~~LOC~~ SOLN
40.0000 mg | SUBCUTANEOUS | Status: DC
  Administered 2019-02-02 – 2019-02-06 (×5): 40 mg via SUBCUTANEOUS
  Filled 2019-02-02 (×6): qty 0.4

## 2019-02-02 MED ORDER — LORATADINE 10 MG PO TABS
10.0000 mg | ORAL_TABLET | Freq: Every day | ORAL | Status: DC
  Administered 2019-02-03 – 2019-02-07 (×5): 10 mg via ORAL
  Filled 2019-02-02 (×5): qty 1

## 2019-02-02 NOTE — ED Provider Notes (Signed)
[facility] EMERGENCY DEPARTMENT Provider Note   CSN: [id number] Arrival date & time: 02/02/19  1408    History   Chief Complaint Chief Complaint  Patient presents with  . Shortness of Breath    HPI [name] is a 63 y.o. female with no significant past medical history who presents the emergency department complaining of worsening dyspnea and wheezing over the course of the last 2 weeks.  Patient states her symptoms started off as sinus congestion and quickly turned into congestion within her chest.  She states that she was seen by her PCP who prescribed her 5-day course of Levaquin, albuterol inhaler, and cough medicine.  She states that by the end of the antibiotic course on 01/27/2019 she had felt better however over the next few days symptoms progressively worsened.  She once again saw her PCP who again prescribed her an albuterol inhaler and cough medicine.  When she was seen in the office this morning PCP told her that there is nothing further that he could provide and that she should come to the emergency department for evaluation.  Patient states that she is had intermittent fevers over the course of last few weeks.  She denies any nausea, vomiting, abdominal pain, or changes in bowel or bladder habits.      Illness  Severity:  Moderate Onset quality:  Gradual Duration:  2 weeks Timing:  Intermittent Progression:  Worsening Chronicity:  New Associated symptoms: congestion, cough, fever, rhinorrhea, shortness of breath and wheezing   Associated symptoms: no abdominal pain, no chest pain, no ear pain, no rash, no sore throat and no vomiting     History reviewed. No pertinent past medical history.  Patient Active Problem List   Diagnosis Date Noted  . Shortness of breath 02/02/2019  . Bronchitis   . Hypoxia   . Viral URI with cough   . NEOPLASM, MALIGNANT, OVARY 02/05/2008  . ALLERGY 02/05/2008    Past Surgical History:  Procedure Laterality  Date  . ABDOMINAL HYSTERECTOMY    . CHOLECYSTECTOMY       OB History   No obstetric history on file.      Home Medications    Prior to Admission medications   Medication Sig Start Date End Date Taking? Authorizing Provider  albuterol (PROVENTIL HFA;VENTOLIN HFA) 108 (90 Base) MCG/ACT inhaler Inhale 2 puffs into the lungs every 4 (four) hours as needed for wheezing or shortness of breath. 12/20/15  Yes Gloriann Loan, PA-C  azelastine (ASTELIN) 0.1 % nasal spray Place 2 sprays into both nostrils 2 (two) times daily. 01/29/19  Yes [provider]  brompheniramine-pseudoephedrine-DM 30-2-10 MG/5ML syrup Take 10 mLs by mouth 4 (four) times daily as needed (cough).  01/29/19  Yes [provider]  CALCIUM PO Take 1 tablet by mouth daily.   Yes [provider]  Fexofenadine HCl (ALLEGRA PO) Take 1 tablet by mouth daily.   Yes [provider]  Multiple Vitamin (MULTIVITAMIN WITH MINERALS) TABS tablet Take 1 tablet by mouth daily. Women's One a Day   Yes [provider]  Omega-3 Fatty Acids (FISH OIL PO) Take 1 capsule by mouth daily.   Yes [provider]  chlorpheniramine-HYDROcodone (TUSSIONEX PENNKINETIC ER) 10-8 MG/5ML SUER Take 5 mLs by mouth at bedtime as needed for cough. Patient not taking: Reported on 02/02/2019 12/20/15   Gloriann Loan, PA-C  HYDROcodone-acetaminophen (NORCO/VICODIN) 5-325 MG tablet Take 1-2 tablets by mouth every 6 hours as needed for pain and/or cough.  Patient not taking: Reported on 02/02/2019 08/06/16   Pisciotta, Elmyra Ricks, PA-C  hydrOXYzine (ATARAX/VISTARIL) 10 MG tablet Take 1 tablet (10 mg total) by mouth 3 (three) times daily as needed for itching. Patient not taking: Reported on 02/02/2019 04/17/13   Melony Overly, MD  levofloxacin (LEVAQUIN) 750 MG tablet Take 750 mg by mouth daily. 5 day course completed 01/26/2019 01/22/19   [provider]  predniSONE (DELTASONE) 10 MG tablet Take 20 mg by mouth daily. 5 day course  completed 01/26/2019 01/22/19   [provider]  triamcinolone cream (KENALOG) 0.1 % Apply topically 2 (two) times daily. Patient not taking: Reported on 02/02/2019 04/17/13   Melony Overly, MD    Family History History reviewed. No pertinent family history.  Social History Social History   Tobacco Use  . Smoking status: Never Smoker  Substance Use Topics  . Alcohol use: Yes  . Drug use: No     Allergies   Sulfur and Strawberry (diagnostic)   Review of Systems Review of Systems  Constitutional: Positive for fever. Negative for chills.  HENT: Positive for congestion and rhinorrhea. Negative for ear pain and sore throat.   Eyes: Negative for pain and visual disturbance.  Respiratory: Positive for cough, shortness of breath and wheezing.   Cardiovascular: Negative for chest pain and palpitations.  Gastrointestinal: Negative for abdominal pain and vomiting.  Genitourinary: Negative for dysuria and hematuria.  Musculoskeletal: Negative for arthralgias and back pain.  Skin: Negative for color change and rash.  Neurological: Negative for seizures and syncope.  All other systems reviewed and are negative.    Physical Exam Updated Vital Signs BP (!) 149/84 (BP Location: Right Arm)   Pulse (!) 121   Temp 98.1 F (36.7 C) (Oral)   Resp (!) 30   Ht 5\' 4"  (1.626 m)   Wt 88.5 kg   SpO2 95%   BMI 33.47 kg/m   Physical Exam Vitals signs and nursing note reviewed.  Constitutional:      General: She is not in acute distress.    Appearance: She is well-developed.  HENT:     Head: Normocephalic and atraumatic.  Eyes:     Conjunctiva/sclera: Conjunctivae normal.  Neck:     Musculoskeletal: Neck supple.  Cardiovascular:     Rate and Rhythm: Normal rate and regular rhythm.     Heart sounds: No murmur.  Pulmonary:     Comments: Mild respiratory distress.  Patient has diffuse inspiratory/expiratory wheezing heard throughout all lung fields.  No accessory muscle usage.  Oxygenating 96% on RA.  Abdominal:     Palpations: Abdomen is soft.     Tenderness: There is no abdominal tenderness.  Skin:    General: Skin is warm and dry.  Neurological:     Mental Status: She is alert and oriented to person, place, and time.     Cranial Nerves: No cranial nerve deficit.     Sensory: No sensory deficit.     Motor: No weakness.      ED Treatments / Results  Labs (all labs ordered are listed, but only abnormal results are displayed) Labs Reviewed  COMPREHENSIVE METABOLIC PANEL - Abnormal; Notable for the following components:      Result Value   Glucose, Bld 145 (*)    All other components within normal limits  CBC WITH DIFFERENTIAL/PLATELET - Abnormal; Notable for the following components:   Platelets 414 (*)    Eosinophils Absolute 0.7 (*)    All other components  within normal limits  RESPIRATORY PANEL BY PCR  LACTIC ACID, PLASMA  LACTIC ACID, PLASMA  PROCALCITONIN  HIV ANTIBODY (ROUTINE TESTING W REFLEX)  COMPREHENSIVE METABOLIC PANEL    EKG EKG Interpretation  Date/Time:  Friday February 02 2019 13:12:26 EDT Ventricular Rate:  106 PR Interval:  138 QRS Duration: 78 QT Interval:  326 QTC Calculation: 433 R Axis:   60 Text Interpretation:  Sinus tachycardia Nonspecific ST abnormality Abnormal ECG No significant change since last tracing Confirmed by Gareth Morgan [phone]) on 02/02/2019 3:55:00 PM   Radiology Dg Chest 2 View  Result Date: 02/02/2019 CLINICAL DATA:  Cough and shortness of breath for 2 weeks. EXAM: CHEST - 2 VIEW COMPARISON:  08/06/2016 FINDINGS: The cardiomediastinal silhouette is unremarkable. Mild peribronchial thickening noted. There is no evidence of focal airspace disease, pulmonary edema, suspicious pulmonary nodule/mass, pleural effusion, or pneumothorax. No acute bony abnormalities are identified. IMPRESSION: No active cardiopulmonary disease. Electronically Signed   By: Margarette Canada M.D.   On: 02/02/2019 15:20     Procedures Procedures (including critical care time)  Medications Ordered in ED Medications  sodium chloride flush (NS) 0.9 % injection 3 mL (3 mLs Intravenous Not Given 02/02/19 1459)  multivitamin with minerals tablet 1 tablet (has no administration in time range)  loratadine (CLARITIN) tablet 10 mg (has no administration in time range)  enoxaparin (LOVENOX) injection 40 mg (has no administration in time range)  acetaminophen (TYLENOL) tablet 650 mg (has no administration in time range)    Or  acetaminophen (TYLENOL) suppository 650 mg (has no administration in time range)  polyethylene glycol (MIRALAX / GLYCOLAX) packet 17 g (has no administration in time range)  ondansetron (ZOFRAN) tablet 4 mg (has no administration in time range)    Or  ondansetron (ZOFRAN) injection 4 mg (has no administration in time range)  methylPREDNISolone sodium succinate (SOLU-MEDROL) 125 mg/2 mL injection 60 mg (has no administration in time range)  guaiFENesin-codeine 100-10 MG/5ML solution 5 mL (has no administration in time range)  benzonatate (TESSALON) capsule 200 mg (has no administration in time range)  albuterol (PROVENTIL) (2.5 MG/3ML) 0.083% nebulizer solution 2.5 mg (has no administration in time range)  ceFEPIme (MAXIPIME) 2 g in sodium chloride 0.9 % 100 mL IVPB (has no administration in time range)  ceFEPIme (MAXIPIME) 1 g in sodium chloride 0.9 % 100 mL IVPB (has no administration in time range)  ipratropium-albuterol (DUONEB) 0.5-2.5 (3) MG/3ML nebulizer solution 3 mL (has no administration in time range)  methylPREDNISolone sodium succinate (SOLU-MEDROL) 125 mg/2 mL injection 125 mg (125 mg Intravenous Given 02/02/19 1545)  ipratropium-albuterol (DUONEB) 0.5-2.5 (3) MG/3ML nebulizer solution 3 mL (3 mLs Nebulization Given 02/02/19 1739)  lactated ringers bolus 1,000 mL (1,000 mLs Intravenous New Bag/Given 02/02/19 1545)  albuterol (PROVENTIL) (2.5 MG/3ML) 0.083% nebulizer solution 5 mg (5 mg  Nebulization Given 02/02/19 1817)     Initial Impression / Assessment and Plan / ED Course  I have reviewed the triage vital signs and the nursing notes.  Pertinent labs & imaging results that were available during my care of the patient were reviewed by me and considered in my medical decision making (see chart for details).        Patient is a 63 year old female who presents the emergency department complaining of worsening dyspnea, wheezing, and upper respiratory symptoms for the past 2 weeks despite 5-day course of Levaquin, PRN albuterol, and cough medicine at home.  On initial evaluation the patient she was hemodynamically stable and in  mild respiratory distress.  Patient was afebrile, tachypneic, mildly hypertensive at 152/94, satting 96% on room air, with a heart rate of 101.  Patient was tachypneic and had diffuse inspiratory/expiratory wheezing heard throughout all lung fields.  Equal pulses in all 4 extremities.  Abdomen is benign.  Prior to my evaluation the patient she had lab work obtained via the triage process.  Labs reviewed by myself and used in medical decision-making.  CBC unremarkable with no evidence of leukocytosis and hemoglobin within normal limits.  CMP with no significant metabolic or electrolyte derangements. Lactic acid within normal limits.  EKG shows sinus tachycardia at 106 bpm, normal intervals, no evidence of ST or T wave abnormalities concerning for acute ischemia.  Given patient's diffuse expiratory/expiratory wheezing heard on auscultation of the lungs DuoNeb treatments were provided as well as IV Solu-Medrol.  Chest xray shows no evidence of acute cardiac/pulmonary pathology. No evidence of focal consolidation that would suggest pneumonia.   RVP negative.  Patient reevaluated on multiple occasions.  Following last DuoNeb treatment she still displayed diffuse expiratory wheezing.  Patient's respiratory rate has improved however she still appears to be  mildly tachypneic.  Oxygenation dropped to 89% with minimal exertion.  Feel patient will require observation admission for spaced out nebulized treatments and ongoing monitoring.  Case was discussed with inpatient medicine services who are in agreement with treatment plan.  She was in stable condition at time of admission.  Final Clinical Impressions(s) / ED Diagnoses   Final diagnoses:  Bronchitis  Hypoxia  Viral URI with cough    ED Discharge Orders    None       Tommie Raymond, MD 02/02/19 [id number]    Gareth Morgan, MD 02/06/19 2215

## 2019-02-02 NOTE — ED Triage Notes (Signed)
Pt was sent here by PCP with cough and wheezing. Wheezing noted in triage. Pt has been taking levaquin. SPeaking in full sentences but obviously SOB.

## 2019-02-02 NOTE — H&P (Addendum)
[name] Admission History and Physical Service Pager: [phone]  Patient name: [name] Medical record number: [id number] Date of birth: 03-26-1956 Age: 63 y.o. Gender: female  Primary Care Provider: Mayra Neer, MD Consultants: None Code Status: FULL   Chief Complaint: shortness of breath  Assessment and Plan: [name] is a 63 y.o. female presenting with worsening sinus infection and shortness of breath x1 month.  Her past medical history is significant for seasonal allergies previous sinus infections.   Respiratory distress likely secondary to underlying asthma  Her history is remarkable for ongoing congestion, cough and increasing SOB for about 1-1/2 months.  She reports increasing sputum production.  She has completed full antibiotic courses of amoxicillin, Augmentin and levofloxacin in outpatient setting without overall relief in her symptoms.  On admission, her exam was remarkable for mild respiratory distress and speaking in 4-5 word sentences, expiratory wheezing noted diffusely with good air movement in all fields.  No crackles on lung exam or evidence of fluid overload to suggest congestive heart failure. She does not have any known h/o asthma, COPD or CHF. Does not have a home O2 requirement.  Her vital signs were notable for tachypnea up to 30, tachycardia up to 118 (likely secondary to albuterol), hypertension to 149/84.  Afebrile in the [facility] the last noted fever of 101F on Thursday 3/12.  Admission labs are notable for WBC 8.8, creatinine 0.70.  Chest x-ray showed no evidence of focal consolidations with mild hyperinflation, making pneumonia less likely. The differential for her respiratory distress includes undiagnosed COPD or asthma, reactive airway disease/allergies, inadequately treated sinus infection, atypical pneumonia, or viral lower respiratory tract infection.  Could also consider alpha-1-antitrypsin deficiency given onset  of symptoms later in life and no reported h/o tobacco use.  Her history of seasonal allergies in addition to animal/pollen allergies makes it likely that this is contributing to her current respiratory distress.  Her history of recurrent sinus infections and now failure of 3 antibiotic courses makes it likely that an ongoing sinus infection continues to contribute.  An atypical pneumonia is possible though unlikely given her well-appearing x-ray and unremarkable white blood cell count.  A contribution from a viral infection remains likely despite a negative RVP.  She denies recent travel or contact with anyone who is tested positive for coronavirus.  We will admit and continue to treat with steroids, breathing treatment and antibiotics.   -Admit to medsurg, attending Dr. Ardelia Mems  -s/p methylprednisolone 125 mg  x1 in ED -Continue Solu-Medrol 60 mg BID; could transition to po steroid once improving  -Continue DuoNeb every 4 hours -Albuterol every 2 hours as needed -Start IV Cefepime 2 mg every 8 hours, per pharmacy recommendations considering her recent failure of outpatient Amoxicillin, Augmentin, and Levofloxacin  -Follow-up procalcitonin -Guaifenesin-codeine as needed for cough -Tessalon Perles as needed for cough -Oxygen as needed to maintain O2 saturations above 92% -monitor resp status -would recommend PFTs as outpatient to further delineate underlying etiology- will include in d/c summary  -Tylenol 650 mg Q6 prn  -Zofran as needed for coughing induced nausea  -continue home Claritin -vitals per unit routine  Sinusitis She reports a history of previous episodes of sinusitis.  This current episode appears to have been ongoing for about 1-1/2 months.  She has so far completed antibiotic courses for amoxicillin, Augmentin and levofloxacin.  She does report mild improvement in her sinus congestion though is not currently back to baseline.  Per pharmacy recommendations, cefepime  would be the most  appropriate antibiotic for her at this time. -IV Cefepime 2 g every 8 hours -Monitor fever curve  -Tylenol 650 mg Q6 prn  -Monitor CBC   FEN/GI: regular diet  Prophylaxis: Lovenox  Disposition: Admit to med-surg, attending Dr. Ardelia Mems   History of Present Illness:  [name] is a 63 y.o. female presenting with worsening sinus infection and shortness of breath for 1 month.  Her past medical history is significant for seasonal allergies and previous sinus infections.  Ms. Reno reports that her symptoms began a little over a month and a half ago with significant sinus congestion and cough.  She was first seen on 1/21 at the [facility] walk-in clinic.  At that time, she was diagnosed with with a sinusitis and given a 10-day course of amoxicillin.    She completed that treatment course without significant improvement and returned to the walk-in clinic on 2/3.  At that time, she continued to complain of wheezing, sinus pressure,  discolored sputum and fever up to 101.49F.  She was again diagnosed with a sinusitis and treated with a 10-day course of Augmentin.    She completed this treatment course and reported some improvement but again worsened and was ultimately seen again at the walk-in clinic on 3/2.  At that time, she reported mild wheezing, congestion and chest pain secondary to coughing.  She was diagnosed with a sinusitis in addition to reactive airway disease and given Levaquin for 5 days, a prednisone taper and albuterol.  She was told to return if she did not experience significant improvement in 3 days.    She returned to the clinic on 3/9 with continued wheezing, cough, chest pain and green/yellow sputum.  She was diagnosed with a reactive airway given brompheniramine and no antibiotics.  She was seen for a final time at the walk-in clinic on 3/13 with continued wheezing.  At that time, the physician at the clinic advised her to be seen at the [facility].  On review of systems, she  reported fever up to 1049F on 3/12.  She also noted chest pain localized to the middle of her chest and left shoulder that was exacerbated by coughing and alleviated with rest.  There is no radiation of this chest pain to jaw or arm and it was only noticed with episodes of coughing.  She did experience constipation earlier in the month with a cough medicine she was using but is currently having normal bowel movements with her most recent bowel movement being the morning of 3/13.  In the ED, she was found to have significant shortness of breath and given Solu-Medrol 125 mg x 1 in addition to DuoNeb treatment x3 with only marginal improvement in her respiratory status.  Chest x-ray showed mild hyperinflation with no consolidations.  She was ultimately admitted for respiratory distress secondary to likely COPD/asthma exacerbation.  She does not have a formal known diagnosis of either.    Review Of Systems: Per HPI with the following additions:   Review of Systems  Constitutional: Positive for fever (last fever to 101 noted 3/12) and malaise/fatigue. Negative for chills (jittery with albuterol).  HENT: Positive for congestion and sinus pain. Negative for hearing loss and sore throat.   Eyes: Negative for blurred vision.  Respiratory: Positive for cough and sputum production. Negative for hemoptysis and shortness of breath.   Cardiovascular: Positive for chest pain (with coughing). Negative for palpitations.  Gastrointestinal: Negative for abdominal pain, constipation, diarrhea, nausea and  vomiting.  Genitourinary: Positive for frequency. Negative for dysuria and flank pain.  Neurological: Positive for tremors (jittery with albuterol) and headaches.   Patient Active Problem List   Diagnosis Date Noted  . Shortness of breath 02/02/2019  . Bronchitis   . Hypoxia   . Viral URI with cough   . NEOPLASM, MALIGNANT, OVARY 02/05/2008  . ALLERGY 02/05/2008   Past Medical History: History reviewed. No  pertinent past medical history.  Past Surgical History: Past Surgical History:  Procedure Laterality Date  . ABDOMINAL HYSTERECTOMY    . CHOLECYSTECTOMY     Social History: Social History   Tobacco Use  . Smoking status: Never Smoker  Substance Use Topics  . Alcohol use: Yes  . Drug use: No   Additional social history: lives with husband and step daughter.  No h/o tobacco use. She reports social alcohol use, no h/o illicit drug use.   Family History: History reviewed. No pertinent family history.  Allergies and Medications: Allergies  Allergen Reactions  . Sulfur Hives  . Strawberry (Diagnostic) Hives    Pt states she can eat 1 or 2 strawberries if she takes benadryl 30 minutes before   No current facility-administered medications on file prior to encounter.    Current Outpatient Medications on File Prior to Encounter  Medication Sig Dispense Refill  . albuterol (PROVENTIL HFA;VENTOLIN HFA) 108 (90 Base) MCG/ACT inhaler Inhale 2 puffs into the lungs every 4 (four) hours as needed for wheezing or shortness of breath. 1 Inhaler 0  . azelastine (ASTELIN) 0.1 % nasal spray Place 2 sprays into both nostrils 2 (two) times daily.    . brompheniramine-pseudoephedrine-DM 30-2-10 MG/5ML syrup Take 10 mLs by mouth 4 (four) times daily as needed (cough).     . CALCIUM PO Take 1 tablet by mouth daily.    Marland Kitchen Fexofenadine HCl (ALLEGRA PO) Take 1 tablet by mouth daily.    . Multiple Vitamin (MULTIVITAMIN WITH MINERALS) TABS tablet Take 1 tablet by mouth daily. Women's One a Day    . Omega-3 Fatty Acids (FISH OIL PO) Take 1 capsule by mouth daily.    Marland Kitchen POTASSIUM PO Take 1 tablet by mouth daily.    . chlorpheniramine-HYDROcodone (TUSSIONEX PENNKINETIC ER) 10-8 MG/5ML SUER Take 5 mLs by mouth at bedtime as needed for cough. (Patient not taking: Reported on 02/02/2019) 20 mL 0  . HYDROcodone-acetaminophen (NORCO/VICODIN) 5-325 MG tablet Take 1-2 tablets by mouth every 6 hours as needed for pain  and/or cough. (Patient not taking: Reported on 02/02/2019) 7 tablet 0  . hydrOXYzine (ATARAX/VISTARIL) 10 MG tablet Take 1 tablet (10 mg total) by mouth 3 (three) times daily as needed for itching. (Patient not taking: Reported on 02/02/2019) 30 tablet 0  . levofloxacin (LEVAQUIN) 750 MG tablet Take 750 mg by mouth daily. 5 day course completed 01/26/2019    . predniSONE (DELTASONE) 10 MG tablet Take 20 mg by mouth daily. 5 day course completed 01/26/2019    . triamcinolone cream (KENALOG) 0.1 % Apply topically 2 (two) times daily. (Patient not taking: Reported on 02/02/2019) 30 g 0   Objective: BP (!) 149/84 (BP Location: Right Arm)   Pulse (!) 121   Temp 98.1 F (36.7 C) (Oral)   Resp (!) 30   Ht 5\' 4"  (1.626 m)   Wt 88.5 kg   SpO2 94%   BMI 33.47 kg/m  Exam: Physical Exam Constitutional:      General: She is in acute distress (mild respiratory distress. Trouble completing  long sentences.).     Appearance: She is well-developed and normal weight.  HENT:     Mouth/Throat:     Mouth: Mucous membranes are moist.  Eyes:     Extraocular Movements: Extraocular movements intact.     Pupils: Pupils are equal, round, and reactive to light.  Neck:     Musculoskeletal: Normal range of motion and neck supple.  Cardiovascular:     Rate and Rhythm: Regular rhythm. Tachycardia present.     Heart sounds: No murmur. No friction rub. No gallop.      Comments: No appreciable JVD Pulmonary:     Effort: Tachypnea present.     Breath sounds: Wheezing present. No decreased breath sounds, rhonchi or rales.  Abdominal:     General: Bowel sounds are normal.     Palpations: Abdomen is soft.  Musculoskeletal:     Right lower leg: Edema (1+ LE edema bilaterally (chronic)) present.     Left lower leg: Edema present.  Lymphadenopathy:     Cervical: No cervical adenopathy.  Skin:    General: Skin is warm and dry.  Neurological:     General: No focal deficit present.     Mental Status: She is alert.      Cranial Nerves: No cranial nerve deficit.     Motor: No weakness.    Labs and Imaging: CBC BMET  Recent Labs  Lab 02/02/19 1421  WBC 8.8  HGB 12.0  HCT 36.4  PLT 414*   Recent Labs  Lab 02/02/19 1421  NA 136  K 3.9  CL 101  CO2 26  BUN 8  CREATININE 0.70  GLUCOSE 145*  CALCIUM 9.6     Dg Chest 2 View  Result Date: 02/02/2019 CLINICAL DATA:  Cough and shortness of breath for 2 weeks. EXAM: CHEST - 2 VIEW COMPARISON:  08/06/2016 FINDINGS: The cardiomediastinal silhouette is unremarkable. Mild peribronchial thickening noted. There is no evidence of focal airspace disease, pulmonary edema, suspicious pulmonary nodule/mass, pleural effusion, or pneumothorax. No acute bony abnormalities are identified. IMPRESSION: No active cardiopulmonary disease. Electronically Signed   By: Margarette Canada M.D.   On: 02/02/2019 15:20    Matilde Haymaker, MD  PGY-1, [place] Intern pager: [phone], text pages welcome  I have seen and evaluated the above patient with Dr. Pilar Plate and agree with his documentation.  I am including my edits in blue.   Lovenia Kim MD [place] PGY-3

## 2019-02-02 NOTE — ED Notes (Signed)
ED TO INPATIENT HANDOFF REPORT  ED Nurse Name and Phone #: Vilinda Blanks RN [phone]  S Name/Age/Gender [name] 63 y.o. female Room/Bed: 021C/021C  Code Status   Code Status: Not on file  Home/SNF/Other Home Patient oriented to: self, place, time and situation Is this baseline? Yes   Triage Complete: Triage complete  Chief Complaint wheezing/sob  Triage Note Pt was sent here by PCP with cough and wheezing. Wheezing noted in triage. Pt has been taking levaquin. SPeaking in full sentences but obviously SOB.    Allergies Allergies  Allergen Reactions  . Sulfur     Level of Care/Admitting Diagnosis ED Disposition    ED Disposition Condition Comment   Admit  Hospital Area: [facility] [[id number]]  Level of Care: Med-Surg [16]  Diagnosis: Shortness of breath [786.05.ICD-9-CM]  Admitting Physician: Rory Percy [[id number]]  Attending Physician: Leeanne Rio [phone]  Estimated length of stay: past midnight tomorrow  Certification:: I certify this patient will need inpatient services for at least 2 midnights  PT Class (Do Not Modify): Inpatient [101]  PT Acc Code (Do Not Modify): Private [1]       B Medical/Surgery History History reviewed. No pertinent past medical history. Past Surgical History:  Procedure Laterality Date  . ABDOMINAL HYSTERECTOMY    . CHOLECYSTECTOMY       A IV Location/Drains/Wounds Patient Lines/Drains/Airways Status   Active Line/Drains/Airways    Name:   Placement date:   Placement time:   Site:   Days:   Peripheral IV 02/02/19 Left;Medial Antecubital   02/02/19    1459    Antecubital   less than 1          Intake/Output Last 24 hours No intake or output data in the 24 hours ending 02/02/19 1912  Labs/Imaging Results for orders placed or performed during the hospital encounter of 02/02/19 (from the past 48 hour(s))  Lactic acid, plasma     Status: None   Collection Time: 02/02/19  2:21 PM  Result  Value Ref Range   Lactic Acid, Venous 1.2 0.5 - 1.9 mmol/L    Comment: Performed at [facility], [address], [place], [place] [address]  Comprehensive metabolic panel     Status: Abnormal   Collection Time: 02/02/19  2:21 PM  Result Value Ref Range   Sodium 136 135 - 145 mmol/L   Potassium 3.9 3.5 - 5.1 mmol/L   Chloride 101 98 - 111 mmol/L   CO2 26 22 - 32 mmol/L   Glucose, Bld 145 (H) 70 - 99 mg/dL   BUN 8 6 - 20 mg/dL   Creatinine, Ser 0.70 0.44 - 1.00 mg/dL   Calcium 9.6 8.9 - 10.3 mg/dL   Total Protein 6.5 6.5 - 8.1 g/dL   Albumin 3.8 3.5 - 5.0 g/dL   AST 22 15 - 41 U/L   ALT 27 0 - 44 U/L   Alkaline Phosphatase 66 38 - 126 U/L   Total Bilirubin 0.6 0.3 - 1.2 mg/dL   GFR calc non Af Amer >60 >60 mL/min   GFR calc Af Amer >60 >60 mL/min   Anion gap 9 5 - 15    Comment: Performed at [facility] [address] [place], [place] [address]  CBC with Differential     Status: Abnormal   Collection Time: 02/02/19  2:21 PM  Result Value Ref Range   WBC 8.8 4.0 - 10.5 K/uL   RBC 3.96 3.87 -  5.11 MIL/uL   Hemoglobin 12.0 12.0 - 15.0 g/dL   HCT 36.4 36.0 - 46.0 %   MCV 91.9 80.0 - 100.0 fL   MCH 30.3 26.0 - 34.0 pg   MCHC 33.0 30.0 - 36.0 g/dL   RDW 11.9 11.5 - 15.5 %   Platelets 414 (H) 150 - 400 K/uL   nRBC 0.0 0.0 - 0.2 %   Neutrophils Relative % 54 %   Neutro Abs 4.7 1.7 - 7.7 K/uL   Lymphocytes Relative 31 %   Lymphs Abs 2.7 0.7 - 4.0 K/uL   Monocytes Relative 6 %   Monocytes Absolute 0.5 0.1 - 1.0 K/uL   Eosinophils Relative 8 %   Eosinophils Absolute 0.7 (H) 0.0 - 0.5 K/uL   Basophils Relative 1 %   Basophils Absolute 0.0 0.0 - 0.1 K/uL   Immature Granulocytes 0 %   Abs Immature Granulocytes 0.03 0.00 - 0.07 K/uL    Comment: Performed at [facility] [address], [place], [place] [address]  Respiratory Panel by PCR     Status: None   Collection Time: 02/02/19  3:49 PM  Result Value Ref Range   Adenovirus NOT DETECTED NOT DETECTED    Coronavirus 229E NOT DETECTED NOT DETECTED    Comment: (NOTE) The Coronavirus on the Respiratory Panel, DOES NOT test for the novel  Coronavirus (2019 nCoV)    Coronavirus HKU1 NOT DETECTED NOT DETECTED   Coronavirus NL63 NOT DETECTED NOT DETECTED   Coronavirus OC43 NOT DETECTED NOT DETECTED   Metapneumovirus NOT DETECTED NOT DETECTED   Rhinovirus / Enterovirus NOT DETECTED NOT DETECTED   Influenza A NOT DETECTED NOT DETECTED   Influenza B NOT DETECTED NOT DETECTED   Parainfluenza Virus 1 NOT DETECTED NOT DETECTED   Parainfluenza Virus 2 NOT DETECTED NOT DETECTED   Parainfluenza Virus 3 NOT DETECTED NOT DETECTED   Parainfluenza Virus 4 NOT DETECTED NOT DETECTED   Respiratory Syncytial Virus NOT DETECTED NOT DETECTED   Bordetella pertussis NOT DETECTED NOT DETECTED   Chlamydophila pneumoniae NOT DETECTED NOT DETECTED   Mycoplasma pneumoniae NOT DETECTED NOT DETECTED    Comment: Performed at [facility], [place] [address], [place], [place] [address]  Lactic acid, plasma     Status: None   Collection Time: 02/02/19  4:19 PM  Result Value Ref Range   Lactic Acid, Venous 1.2 0.5 - 1.9 mmol/L    Comment: Performed at [facility] [address], [place], [place] [address]   Dg Chest 2 View  Result Date: 02/02/2019 CLINICAL DATA:  Cough and shortness of breath for 2 weeks. EXAM: CHEST - 2 VIEW COMPARISON:  08/06/2016 FINDINGS: The cardiomediastinal silhouette is unremarkable. Mild peribronchial thickening noted. There is no evidence of focal airspace disease, pulmonary edema, suspicious pulmonary nodule/mass, pleural effusion, or pneumothorax. No acute bony abnormalities are identified. IMPRESSION: No active cardiopulmonary disease. Electronically Signed   By: Margarette Canada M.D.   On: 02/02/2019 15:20    Pending Labs Unresulted Labs (From admission, onward)   None      Vitals/Pain Today's Vitals   02/02/19 1800 02/02/19 1830 02/02/19 1845 02/02/19 1900  BP: 123/73 125/66  136/62 139/70  Pulse: (!) 101 (!) 113 (!) 121 (!) 118  Resp: 19 20 19  (!) 30  Temp:      TempSrc:      SpO2: 99% 96% 94% 93%  Weight:      Height:      PainSc:  Isolation Precautions Droplet precaution  Medications Medications  sodium chloride flush (NS) 0.9 % injection 3 mL (3 mLs Intravenous Not Given 02/02/19 1459)  methylPREDNISolone sodium succinate (SOLU-MEDROL) 125 mg/2 mL injection 125 mg (125 mg Intravenous Given 02/02/19 1545)  ipratropium-albuterol (DUONEB) 0.5-2.5 (3) MG/3ML nebulizer solution 3 mL (3 mLs Nebulization Given 02/02/19 1739)  lactated ringers bolus 1,000 mL (1,000 mLs Intravenous New Bag/Given 02/02/19 1545)  albuterol (PROVENTIL) (2.5 MG/3ML) 0.083% nebulizer solution 5 mg (5 mg Nebulization Given 02/02/19 1817)    Mobility walks with person assist Low fall risk   Focused Assessments Pulmonary Assessment Handoff:  Lung sounds: Bilateral Breath Sounds: Diminished, Expiratory wheezes O2 Device: Room Air        R Recommendations: See Admitting Provider Note  Report given to:   Additional Notes: Pt from home with incr SOB, NP cough, and wheezing despite home inhalers and antibiotics from PCP. Respiratory panel negative, CXR clear.

## 2019-02-03 LAB — COMPREHENSIVE METABOLIC PANEL WITH GFR
ALT: 28 U/L (ref 0–44)
AST: 22 U/L (ref 15–41)
Albumin: 3.5 g/dL (ref 3.5–5.0)
Alkaline Phosphatase: 62 U/L (ref 38–126)
Anion gap: 11 (ref 5–15)
BUN: 13 mg/dL (ref 6–20)
CO2: 21 mmol/L — ABNORMAL LOW (ref 22–32)
Calcium: 9.1 mg/dL (ref 8.9–10.3)
Chloride: 102 mmol/L (ref 98–111)
Creatinine, Ser: 0.76 mg/dL (ref 0.44–1.00)
GFR calc Af Amer: >60 mL/min
GFR calc non Af Amer: >60 mL/min
Glucose, Bld: 231 mg/dL — ABNORMAL HIGH (ref 70–99)
Potassium: 4 mmol/L (ref 3.5–5.1)
Sodium: 134 mmol/L — ABNORMAL LOW (ref 135–145)
Total Bilirubin: 0.5 mg/dL (ref 0.3–1.2)
Total Protein: 6.3 g/dL — ABNORMAL LOW (ref 6.5–8.1)

## 2019-02-03 LAB — HIV ANTIBODY (ROUTINE TESTING W REFLEX): HIV Screen 4th Generation wRfx: NONREACTIVE

## 2019-02-03 MED ORDER — ALBUTEROL SULFATE (2.5 MG/3ML) 0.083% IN NEBU
2.5000 mg | INHALATION_SOLUTION | Freq: Four times a day (QID) | RESPIRATORY_TRACT | Status: DC
  Administered 2019-02-04: 2.5 mg via RESPIRATORY_TRACT
  Filled 2019-02-03 (×2): qty 3

## 2019-02-03 NOTE — Progress Notes (Signed)
Interim Progress Note  I am attending for FM. I have not yet seen patient in person (will see them shortly) but have discussed the patient with resident team.  I contacted infection prevention about this patient given fever at home, cough, and dyspnea with negative RVP. Per current [facility] guidelines, patient meets criteria for testing for COVID. This will have to be approved through the health department before testing can be done.  Infection prevention advised placing patient on droplet and contact precautions, and instructions for RT to wear n95 masks during nebulizer treatments.  Infection prevention is going to reach out to the health department to see if COVID testing is approved. They will contact me directly with the HD's decision.  Leeanne Rio, MD

## 2019-02-03 NOTE — Progress Notes (Signed)
Family Medicine Teaching Service Daily Progress Note Intern Pager: [phone]  Patient name: [name] Medical record number: [id number] Date of birth: [birth year] Age: 63 y.o. Gender: female  Primary Care Provider: Mayra Neer, MD Consultants: None Code Status: Full  Pt Overview and Major Events to Date:  3/13 - admission for respiratory distress likely 2/2 asthma  Assessment and Plan: [name] is a 63 y.o. female presenting with worsening sinus infection and shortness of breath x1 month.  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  Respiratory distress secondary to undiagnosed asthma-improving This morning she reports decreased wheezing and mild improvement in coughing overnight.  Vitals overnight were notable for tachypnea to 30, tachycardia to 121.  Physical exam this morning was remarkable for improved wheezing with only mild wheezing noted in RLL.  As she appears to be responding to steroids and breathing treatments, it is likely she does have undiagnosed asthma or COPD.  Outpatient PFTs will be helpful. -Transition to p.o. prednisone -Continue duo nebs every 4 hours -Continue albuterol nebs every 2 hours as needed -Guaifenesin-codeine as needed -Tessalon Perles as needed  Sinusitis She has remained afebrile since 3/12.  Her white blood count on admission was unremarkable though it may show elevation secondary to steroid use.  Procalcitonin was undetectable at <0.10.  She was initiated on cefepime on admission out of concern for an inadequately treated sinusitis.  She reports moderate improvement overnight. -Continue cefepime  Seasonal allergies -Continue Claritin daily  FEN/GI: General diet PPx: Lovenox  Disposition: anticipate 1-2 additional days before she is medically stable for discharge  Subjective:  She reported improvement in her respiratory status this morning  specifically decreased wheezing and mildly improved coughing.  She does not feel that she is close to baseline at this point but is grateful that she appears to be going in the right direction.  She has no new concerns this morning.  There were no acute events overnight.  Objective: Temp:  [98 F (36.7 C)-98.8 F (37.1 C)] 98 F (36.7 C) (03/13 2315) Pulse Rate:  [94-121] 108 (03/13 2315) Resp:  [16-30] 24 (03/13 2315) BP: (117-160)/(62-96) 133/69 (03/13 2315) SpO2:  [93 %-100 %] 95 % (03/13 2329) Weight:  [88.5 kg] 88.5 kg (03/13 1557)  Physical Exam: General: Sitting comfortably on the edge of her bed this morning in mild respiratory distress.  Continues to struggle to complete long sentences in a single breath. HEENT: Mild cervical lymphadenopathy Cardio: Mild tachycardia.  Normal S1 and S2, no S3 or S4. Rhythm is regular. No murmurs or rubs.   Pulm: Mild tachypnea on room air.  Auscultation revealed moderate air movement in all lung fields and mild expiratory wheezing in the right lower field.  No crackles or stridor noted. Abdomen: Bowel sounds normal. Abdomen soft and non-tender.  Extremities: No peripheral edema. Warm/ well perfused.  Strong radial pulse. Neuro: Cranial nerves grossly intact   Laboratory: Recent Labs  Lab 02/02/19 1421  WBC 8.8  HGB 12.0  HCT 36.4  PLT 414*   Recent Labs  Lab 02/02/19 1421  NA 136  K 3.9  CL 101  CO2 26  BUN 8  CREATININE 0.70  CALCIUM 9.6  PROT 6.5  BILITOT 0.6  ALKPHOS 66  ALT 27  AST 22  GLUCOSE 145*    Imaging/Diagnostic Tests: Dg Chest 2 View  Result Date: 02/02/2019 CLINICAL DATA:  Cough and shortness of breath for 2 weeks.  EXAM: CHEST - 2 VIEW COMPARISON:  08/06/2016 FINDINGS: The cardiomediastinal silhouette is unremarkable. Mild peribronchial thickening noted. There is no evidence of focal airspace disease, pulmonary edema, suspicious pulmonary nodule/mass, pleural effusion, or pneumothorax. No acute bony  abnormalities are identified. IMPRESSION: No active cardiopulmonary disease. Electronically Signed   By: Margarette Canada M.D.   On: 02/02/2019 15:20     Matilde Haymaker, MD 02/03/2019, 2:04 AM PGY-1, [place] Intern pager: [phone], text pages welcome

## 2019-02-04 ENCOUNTER — Encounter (HOSPITAL_COMMUNITY): Payer: Self-pay

## 2019-02-04 LAB — HEMOGLOBIN A1C
HEMOGLOBIN A1C: 5.4 % (ref 4.8–5.6)
Mean Plasma Glucose: 108.28 mg/dL

## 2019-02-04 MED ORDER — INSULIN ASPART 100 UNIT/ML ~~LOC~~ SOLN: 0.0000 [IU] | Freq: Three times a day (TID) | SUBCUTANEOUS | Status: DC

## 2019-02-04 MED ORDER — FLUTICASONE PROPIONATE 50 MCG/ACT NA SUSP
2.0000 | Freq: Every day | NASAL | Status: DC
  Administered 2019-02-04 – 2019-02-07 (×4): 2 via NASAL
  Filled 2019-02-04: qty 16

## 2019-02-04 MED ORDER — ALBUTEROL SULFATE HFA 108 (90 BASE) MCG/ACT IN AERS
2.0000 | INHALATION_SPRAY | RESPIRATORY_TRACT | Status: DC
  Administered 2019-02-04 – 2019-02-06 (×13): 2 via RESPIRATORY_TRACT
  Filled 2019-02-04: qty 6.7

## 2019-02-04 MED ORDER — ALBUTEROL SULFATE HFA 108 (90 BASE) MCG/ACT IN AERS
2.0000 | INHALATION_SPRAY | RESPIRATORY_TRACT | Status: DC | PRN
  Administered 2019-02-05: 2 via RESPIRATORY_TRACT
  Filled 2019-02-04: qty 6.7

## 2019-02-04 MED ORDER — PREDNISONE 20 MG PO TABS
40.0000 mg | ORAL_TABLET | Freq: Every day | ORAL | Status: AC
  Administered 2019-02-04 – 2019-02-06 (×3): 40 mg via ORAL
  Filled 2019-02-04 (×3): qty 2

## 2019-02-04 MED ORDER — PREDNISONE 20 MG PO TABS: 40.0000 mg | ORAL_TABLET | Freq: Every day | ORAL | Status: DC

## 2019-02-04 MED ORDER — SODIUM CHLORIDE 0.9 % IV SOLN
1.0000 g | Freq: Three times a day (TID) | INTRAVENOUS | Status: DC
  Administered 2019-02-04 – 2019-02-05 (×3): 1 g via INTRAVENOUS
  Filled 2019-02-04 (×4): qty 1

## 2019-02-04 NOTE — Plan of Care (Signed)
  Problem: Education: Goal: Knowledge of General Education information will improve Description: Including pain rating scale, medication(s)/side effects and non-pharmacologic comfort measures Outcome: Progressing   Problem: Clinical Measurements: Goal: Respiratory complications will improve Outcome: Progressing   Problem: Activity: Goal: Risk for activity intolerance will decrease Outcome: Progressing   

## 2019-02-04 NOTE — Progress Notes (Signed)
Pharmacy Antibiotic Note  [name] is a 63 y.o. female admitted on 02/02/2019 with possible pneumonia.  Pharmacy has been consulted for cefepime dosing. She was recently of cefepime with last dose at midnight -SCr= 0.76, CrCl ~ 80 -PCT < 0.1  Plan:  -Cefepime 1gm IV q8h -Will follow renal function, cultures and clinical progress    Height: 5\' 4"  (162.6 cm) Weight: 195 lb (88.5 kg) IBW/kg (Calculated) : 54.7  Temp (24hrs), Avg:98.4 F (36.9 C), Min:98.2 F (36.8 C), Max:98.7 F (37.1 C)  Recent Labs  Lab 02/02/19 1421 02/02/19 1619 02/03/19 0306  WBC 8.8  --   --   CREATININE 0.70  --  0.76  LATICACIDVEN 1.2 1.2  --     Estimated Creatinine Clearance: 80.5 mL/min (by C-G formula based on SCr of 0.76 mg/dL).    Allergies  Allergen Reactions  . Sulfur Hives  . Strawberry (Diagnostic) Hives    Pt states she can eat 1 or 2 strawberries if she takes benadryl 30 minutes before    Antimicrobials this admission: 3/13 cefepime  Dose adjustments this admission:   Microbiology results: 3/15 resp panel- neg 3/15 COVID19 - sent  Thank you for allowing pharmacy to be a part of this patient's care.  Hildred Laser, PharmD Clinical Pharmacist **Pharmacist phone directory can now be found on [email or web address] ([email or web address]).  Listed under [place].

## 2019-02-04 NOTE — Progress Notes (Addendum)
Family Medicine Teaching Service Daily Progress Note Intern Pager: [phone]  Patient name: [name] Medical record number: [id number] Date of birth: Mar 20, 1956 Age: 63 y.o. Gender: female  Primary Care Provider: Mayra Neer, MD Consultants: None Code Status: Full  Pt Overview and Major Events to Date:  3/13 - admission for respiratory distress likely 2/2 asthma  Assessment and Plan: [name] is a 63 y.o. female presenting with worsening sinus infection and shortness of breath x1 month.  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  Acute Hypoxic Respiratory Failure, improved Asthma vs. COPD Exacerbation Underylying URI Person under investigation (PUI) for COVID19 SARS-CoV-2 respiratory swab pending. Negative RVP. Procalcitonin negative. Resp status improve, SORA at rest, but short of breath when talking or ambulating. Wheezing present with cough, but no distress. Day 3 of steroids. S/p IV solumedrol x2 days. CXR w/ only Mild peribronchial thickening, no PNA noted.   -Transition to PO prednisone 40 mg for 5 day course of steroids - d/c duonebs given aerosolization and concern for COVID19 - switched to albuterol 2 puff q4h, 2 hours prn -Guaifenesin-codeine as needed -Tessalon Perles as needed -Contact/Droplet precautions  ?Allergic Sinusitis Seasonal allergies Afebrile. Procalcitonin negative. H/o seasonal allergies. Likely has baseline, mild allergic sinusitis worsened with underlying URI.  -D/C cefepime.  -Continue Claritin daily -add flonase  Hyperglycosemia  CBG 231. Likely due to steroids, but higher than expected - HgA1C - monitor daily CBGs w/ BMP  FEN/GI: General diet PPx: Lovenox  Disposition: inpatient pending improvement in respiratory status  Subjective:  No acute events overnight. SORA at rest, but SOB when talking or ambulating. Actively coughing. No  respiratory distress.   Objective: Temp:  [98 F (36.7 C)-98.4 F (36.9 C)] 98.2 F (36.8 C) (03/15 0500) Pulse Rate:  [99-110] 99 (03/15 0500) Resp:  [16-24] 18 (03/15 0500) BP: (143-159)/(89-99) 148/90 (03/15 0500) SpO2:  [94 %-98 %] 98 % (03/15 0500)  Physical Exam: Gen: NAD, sitting up in bed CV: RRR with no murmurs appreciated Pulm: SOB when talking, sats wnl on RA, wheeze in right lower lung GI: Normal bowel sounds present. Soft, Nontender, Nondistended. MSK: trace edema Skin: warm, dry Neuro: grossly normal, moves all extremities Psych: Normal affect and thought content  Laboratory: Recent Labs  Lab 02/02/19 1421  WBC 8.8  HGB 12.0  HCT 36.4  PLT 414*   Recent Labs  Lab 02/02/19 1421 02/03/19 0306  NA 136 134*  K 3.9 4.0  CL 101 102  CO2 26 21*  BUN 8 13  CREATININE 0.70 0.76  CALCIUM 9.6 9.1  PROT 6.5 6.3*  BILITOT 0.6 0.5  ALKPHOS 66 62  ALT 27 28  AST 22 22  GLUCOSE 145* 231*    Imaging/Diagnostic Tests: No results found.   Bonnita Hollow, MD 02/04/2019, 6:36 AM PGY-2, [place] Intern pager: [phone], text pages welcome

## 2019-02-04 NOTE — Progress Notes (Signed)
Patient given neb treatment per order.  This RT wore contact precaution PPE and a N95 mask per infection prevention recommendation.  Please see Dr. Lennie Odor note from 3/14 at 1012 regarding neb treatments.

## 2019-02-04 NOTE — Plan of Care (Signed)

## 2019-02-04 NOTE — Progress Notes (Signed)
Patients nebulizer treatment changed to Albuterol inhaler Q4 & Q2 hours PRN per on call MD. Nebulizer treatment discontinued.

## 2019-02-04 NOTE — Plan of Care (Signed)

## 2019-02-05 LAB — BASIC METABOLIC PANEL
Anion gap: 11 (ref 5–15)
BUN: 15 mg/dL (ref 6–20)
CO2: 25 mmol/L (ref 22–32)
CREATININE: 0.65 mg/dL (ref 0.44–1.00)
Calcium: 9.4 mg/dL (ref 8.9–10.3)
Chloride: 102 mmol/L (ref 98–111)
GFR calc Af Amer: >60 mL/min (ref >60)
GFR calc non Af Amer: >60 mL/min (ref >60)
Glucose, Bld: 103 mg/dL — ABNORMAL HIGH (ref 70–99)
Potassium: 3.7 mmol/L (ref 3.5–5.1)
SODIUM: 138 mmol/L (ref 135–145)

## 2019-02-05 LAB — CBC
HCT: 36.1 % (ref 36.0–46.0)
Hemoglobin: 11.8 g/dL — ABNORMAL LOW (ref 12.0–15.0)
MCH: 29.8 pg (ref 26.0–34.0)
MCHC: 32.7 g/dL (ref 30.0–36.0)
MCV: 91.2 fL (ref 80.0–100.0)
Platelets: 427 10*3/uL — ABNORMAL HIGH (ref 150–400)
RBC: 3.96 MIL/uL (ref 3.87–5.11)
RDW: 12.4 % (ref 11.5–15.5)
WBC: 16.8 10*3/uL — AB (ref 4.0–10.5)
nRBC: 0 % (ref 0.0–0.2)

## 2019-02-05 MED ORDER — CEFDINIR 300 MG PO CAPS
300.0000 mg | ORAL_CAPSULE | Freq: Two times a day (BID) | ORAL | Status: DC
  Administered 2019-02-05 – 2019-02-07 (×5): 300 mg via ORAL
  Filled 2019-02-05 (×6): qty 1

## 2019-02-05 NOTE — Plan of Care (Signed)
  Problem: Education: Goal: Knowledge of General Education information will improve Description Including pain rating scale, medication(s)/side effects and non-pharmacologic comfort measures Outcome: Progressing   

## 2019-02-05 NOTE — Progress Notes (Signed)
Family Medicine Teaching Service Daily Progress Note Intern Pager: [phone]  Patient name: [name] Medical record number: [id number] Date of birth: Oct 25, 1956 Age: 62 y.o. Gender: female  Primary Care Provider: Mayra Neer, MD Consultants: None Code Status: Full  Pt Overview and Major Events to Date:  3/13 - admission for increased WOB/cough/fever  Assessment and Plan: LASHEA GODA is a 63 y.o. female presenting with worsening sinus infection, cough, and shortness of breath x1 month.  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  Acute Hypoxic Respiratory Failure, improved Wheezing Underlying URI vs seasonal allergies vs chronic sinusitis Person under investigation (PUI) for COVID19 No known history of COPD/asthma. CXR without obvious pathology. Significant wheezing and increased work of breathing on presentation, with fever at home. SARS-CoV-2 respiratory swab pending. Negative RVP. resp status improving - less wheezing today with good air movement. Symptomatically improving as well per patient.  - On PO prednisone now, complete 5 day course - MDI albuterol (no nebs due to COVID rule out) -Guaifenesin-codeine as needed -Tessalon Perles as needed -Contact/Droplet precautions - change IV cefepime to PO cefdinir today - continue claritin, flonase - ambulate with pulse ox to assess O2 need  Hyperglycemia CBG 231 on 3/15 BMET. Suspect due to steroids. A1c normal. Will not chase sugars inpatient   FEN/GI: General diet PPx: Lovenox  Disposition: hopeful discharge tomorrow.  Subjective:  Feeling improved today. No hypoxia or fever documented. Wary about going home until [place] testing has returned as she will be staying with her daughter.  Objective: Temp:  [98.1 F (36.7 C)-98.6 F (37 C)] 98.6 F (37 C) (03/16 1351) Pulse Rate:  [92-98] 95 (03/16 1351) Resp:  [16-17] 16  (03/16 1351) BP: (138-153)/(78-95) 140/85 (03/16 1351) SpO2:  [93 %-96 %] 93 % (03/16 1351)  Physical Exam: Gen: NAD, well appearing CV: RRR with no murmurs appreciated Pulm: normal respiratory effort, much improved from prior. Occasional wheezing but good air movement throughout.  Extremities: No calf tenderness bilaterally Skin: warm, dry Neuro: grossly normal, speech intact Psych: Normal affect and thought content  Laboratory: Recent Labs  Lab 02/02/19 1421 02/05/19 1231  WBC 8.8 16.8*  HGB 12.0 11.8*  HCT 36.4 36.1  PLT 414* 427*   Recent Labs  Lab 02/02/19 1421 02/03/19 0306 02/05/19 1231  NA 136 134* 138  K 3.9 4.0 3.7  CL 101 102 102  CO2 26 21* 25  BUN 8 13 15   CREATININE 0.70 0.76 0.65  CALCIUM 9.6 9.1 9.4  PROT 6.5 6.3*  --   BILITOT 0.6 0.5  --   ALKPHOS 66 62  --   ALT 27 28  --   AST 22 22  --   GLUCOSE 145* 231* 103*    Imaging/Diagnostic Tests: No results found.   Leeanne Rio, MD 02/05/2019, 2:02 PM Faculty, [place] Intern pager: [phone], text pages welcome

## 2019-02-06 LAB — BASIC METABOLIC PANEL
ANION GAP: 7 (ref 5–15)
BUN: 15 mg/dL (ref 6–20)
CHLORIDE: 103 mmol/L (ref 98–111)
CO2: 27 mmol/L (ref 22–32)
Calcium: 9 mg/dL (ref 8.9–10.3)
Creatinine, Ser: 0.63 mg/dL (ref 0.44–1.00)
GFR calc Af Amer: >60 mL/min (ref >60)
GFR calc non Af Amer: >60 mL/min (ref >60)
GLUCOSE: 88 mg/dL (ref 70–99)
Potassium: 3.9 mmol/L (ref 3.5–5.1)
Sodium: 137 mmol/L (ref 135–145)

## 2019-02-06 LAB — CBC
HCT: 33.5 % — ABNORMAL LOW (ref 36.0–46.0)
Hemoglobin: 10.8 g/dL — ABNORMAL LOW (ref 12.0–15.0)
MCH: 29.3 pg (ref 26.0–34.0)
MCHC: 32.2 g/dL (ref 30.0–36.0)
MCV: 90.8 fL (ref 80.0–100.0)
Platelets: 384 10*3/uL (ref 150–400)
RBC: 3.69 MIL/uL — ABNORMAL LOW (ref 3.87–5.11)
RDW: 12.3 % (ref 11.5–15.5)
WBC: 14.3 10*3/uL — ABNORMAL HIGH (ref 4.0–10.5)
nRBC: 0 % (ref 0.0–0.2)

## 2019-02-06 LAB — NOVEL CORONAVIRUS, NAA (HOSP ORDER, SEND-OUT TO REF LAB; TAT 18-24 HRS): SARS-CoV-2, NAA: NOT DETECTED

## 2019-02-06 MED ORDER — AMLODIPINE BESYLATE 5 MG PO TABS
5.0000 mg | ORAL_TABLET | Freq: Every day | ORAL | Status: DC
  Administered 2019-02-06 – 2019-02-07 (×2): 5 mg via ORAL
  Filled 2019-02-06 (×2): qty 1

## 2019-02-06 NOTE — Plan of Care (Signed)
  Problem: Clinical Measurements: Goal: Ability to maintain clinical measurements within normal limits will improve Outcome: Progressing Goal: Respiratory complications will improve Outcome: Progressing Goal: Cardiovascular complication will be avoided Outcome: Progressing   Problem: Pain Managment: Goal: General experience of comfort will improve Outcome: Progressing   Problem: Safety: Goal: Ability to remain free from injury will improve Outcome: Progressing   Problem: Skin Integrity: Goal: Risk for impaired skin integrity will decrease Outcome: Progressing

## 2019-02-06 NOTE — Progress Notes (Addendum)
Family Medicine Teaching Service Daily Progress Note Intern Pager: [phone]  Patient name: [name] Medical record number: [id number] Date of birth: 10-Jun-1956 Age: 63 y.o. Gender: female  Primary Care Provider: Mayra Neer, MD Consultants: None Code Status: Full  Pt Overview and Major Events to Date:  3/13 - admission for increased WOB/cough/fever  Assessment and Plan: [name] is a 63 y.o. female presenting with worsening sinus infection, cough, and shortness of breath x1 month.  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  Acute Hypoxic Respiratory Failure, improved Wheezing Underlying URI vs seasonal allergies vs chronic sinusitis Person under investigation (PUI) for COVID19 No known history of COPD/asthma. CXR without obvious pathology. Significant wheezing and increased work of breathing on presentation, with fever at home. SARS-CoV-2 respiratory swab still pending. Negative RVP. Resp status continues to improve with better air movement and less wheezing. Patient up ambulating about room today on room air. - On PO prednisone now, complete 5 day course - MDI albuterol (no nebs due to COVID rule out) -Guaifenesin-codeine as needed -Tessalon Perles as needed -Contact/Droplet precautions while COVID pending - continue PO cefdinir - continue claritin, flonase - ambulate with pulse ox to assess O2 need - not done yesterday, asked nursing to complete this today  Hyperglycemia Likely due to steroids, resolved  FEN/GI: General diet PPx: Lovenox  Disposition: dispo pending COVID result  Subjective:  Continues to feel better today. Still does not want to discharge until COVID result is back as she is concerned with exposing her family members.  Objective: Temp:  [98.2 F (36.8 C)-98.4 F (36.9 C)] 98.2 F (36.8 C) (03/17 0228) Pulse Rate:  [89-104] 89 (03/17  0228) Resp:  [16-18] 16 (03/17 0228) BP: (143-156)/(83-84) 156/83 (03/17 0228) SpO2:  [93 %-96 %] 96 % (03/17 1205)  Physical Exam: Gen: NAD, well appearing, up ambulating in room CV: RRR with no murmurs appreciated Pulm: normal respiratory effort, continues to improve. Good air movement, just occasional wheeze Skin: warm, dry Neuro: grossly normal, speech intact Psych: Normal affect and thought content  Laboratory: Recent Labs  Lab 02/02/19 1421 02/05/19 1231 02/06/19 0457  WBC 8.8 16.8* 14.3*  HGB 12.0 11.8* 10.8*  HCT 36.4 36.1 33.5*  PLT 414* 427* 384   Recent Labs  Lab 02/02/19 1421 02/03/19 0306 02/05/19 1231 02/06/19 0457  NA 136 134* 138 137  K 3.9 4.0 3.7 3.9  CL 101 102 102 103  CO2 26 21* 25 27  BUN 8 13 15 15   CREATININE 0.70 0.76 0.65 0.63  CALCIUM 9.6 9.1 9.4 9.0  PROT 6.5 6.3*  --   --   BILITOT 0.6 0.5  --   --   ALKPHOS 66 62  --   --   ALT 27 28  --   --   AST 22 22  --   --   GLUCOSE 145* 231* 103* 88    Imaging/Diagnostic Tests: No results found.   Leeanne Rio, MD Faculty, [place] Intern pager: [phone], text pages welcome

## 2019-02-06 NOTE — Plan of Care (Signed)

## 2019-02-07 MED ORDER — CEFDINIR 300 MG PO CAPS: 300.0000 mg | ORAL_CAPSULE | Freq: Two times a day (BID) | ORAL | 0 refills | Status: AC

## 2019-02-07 MED ORDER — ALBUTEROL SULFATE HFA 108 (90 BASE) MCG/ACT IN AERS: 2.0000 | INHALATION_SPRAY | RESPIRATORY_TRACT | 1 refills | Status: AC | PRN

## 2019-02-07 MED ORDER — AMLODIPINE BESYLATE 5 MG PO TABS: 5.0000 mg | ORAL_TABLET | Freq: Every day | ORAL | 1 refills | Status: AC

## 2019-02-07 MED ORDER — ALBUTEROL SULFATE HFA 108 (90 BASE) MCG/ACT IN AERS: 2.0000 | INHALATION_SPRAY | RESPIRATORY_TRACT | 1 refills | Status: DC | PRN

## 2019-02-07 MED FILL — CEFDINIR 300 MG CAPSULE: 300 | 5 days supply | Qty: 9 | Fill #0 | Status: TO

## 2019-02-07 MED FILL — AMLODIPINE BESYLATE 5 MG TA: 5 | 30 days supply | Qty: 30 | Fill #0 | Status: TO

## 2019-02-07 NOTE — Progress Notes (Signed)
Pt just completed a lap around the unit. During this time her pulse was 118 and oxygen was 95%.

## 2019-02-07 NOTE — Progress Notes (Signed)
Discharge paperwork and instructions given to pt. Pt and pt's daughter understand all instructions for discharge. Pt not in distress and tolerated well.

## 2019-02-07 NOTE — Discharge Instructions (Signed)
Please take the full course of your antibiotic as prescribed.    We have started amlodipine for your blood pressure.  Please continue to take this once a day.  You can take it before bed time for better tolerance.    We have given you a prescription for an albuterol inhaler.  Please use this as needed for wheezing and difficulty breathing.

## 2019-02-07 NOTE — Plan of Care (Addendum)
Noted lab result of corona virus, not detected. Updated charge nurse and call out to covering MD. Dr Milus Banister with call back. Request to advise per droplet precautions and contact policy. Follow up with day shift Nurse.

## 2019-02-07 NOTE — Plan of Care (Signed)

## 2019-02-07 NOTE — Progress Notes (Signed)
Family Medicine Teaching Service Daily Progress Note Intern Pager: [phone]  Patient name: [name] Medical record number: [id number] Date of birth: 29-May-1956 Age: 63 y.o. Gender: female  Primary Care Provider: Mayra Neer, MD Consultants: ID Code Status: Full code  Pt Overview and Major Events to Date:  Hospital Day 5 Admitted: 02/02/2019   Assessment and Plan: Loretta Belle Holmesis a 63 y.o.femalepresenting with worsening sinus infection, cough, and shortness of breathx1 month. 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  Acute Hypoxic Respiratory Failure, improved. Wheezing - no hx of copd/asthma, negative RVP. Differential is URI, seasonal allergies, chronic sinusitus.  COVID test is negative. CXR unrevealing.  Significant wheezing and increased work of breathing on presentation, with fever at home. Can discharge today after ambulation with pulse ox.  - On PO prednisone now, complete 5 day course - MDI albuterol (no nebs due to COVID rule out) -Guaifenesin-codeine as needed -[employer] as needed -Contact/Droplet precautions  - continue PO cefdinir - continue claritin, flonase - ambulate with pulse ox to assess O2 need -   Hyperglycemia Likely due to steroids, resolved  FEN/GI: General diet PPx: Lovenox Disposition: home   Medications: Scheduled Meds: . albuterol  2 puff Inhalation Q4H  . amLODipine  5 mg Oral Daily  . cefdinir  300 mg Oral Q12H  . enoxaparin (LOVENOX) injection  40 mg Subcutaneous Q24H  . fluticasone  2 spray Each Nare Daily  . loratadine  10 mg Oral Daily  . multivitamin with minerals  1 tablet Oral Daily  . polyethylene glycol  17 g Oral Daily  . sodium chloride flush  3 mL Intravenous Once   Continuous Infusions: PRN Meds: acetaminophen **OR** acetaminophen, albuterol, benzonatate, guaiFENesin-codeine, ondansetron **OR** ondansetron  (ZOFRAN) IV  ================================================= ================================================= Subjective:  Patient feels better.  She says she is ready to go home.  Has been able to walk to the bathrrooom without getting out of breath.  She believes she has pneumonia because she's 'had it before'.    Objective: Temp:  [98.4 F (36.9 C)-98.7 F (37.1 C)] 98.4 F (36.9 C) (03/18 0406) Pulse Rate:  [84-89] 84 (03/18 0406) Resp:  [15-17] 15 (03/18 0406) BP: (126-142)/(76-89) 134/89 (03/18 0406) SpO2:  [95 %-98 %] 95 % (03/18 0406) Intake/Output 03/17 0701 - 03/18 0700 In: 480 [P.O.:480] Out: -  Physical Exam:  Gen: NAD, alert,  CV: Regular rate and rhythm.  Radial pulses 2+ bilaterally. No bilateral lower extremity edema. Resp: some slight wheezing bilaterally.  Mild basilar crackles.  Coughing on deep inspiration. Speaks full sentences.   Abd: Nontender and nondistended on palpation to all 4 quadrants.  Positive bowel sounds. Psych: Cooperative with exam. Pleasant. Makes eye contact.  Laboratory: Recent Labs  Lab 02/02/19 1421 02/05/19 1231 02/06/19 0457  WBC 8.8 16.8* 14.3*  HGB 12.0 11.8* 10.8*  HCT 36.4 36.1 33.5*  PLT 414* 427* 384   Recent Labs  Lab 02/02/19 1421 02/03/19 0306 02/05/19 1231 02/06/19 0457  NA 136 134* 138 137  K 3.9 4.0 3.7 3.9  CL 101 102 102 103  CO2 26 21* 25 27  BUN 8 13 15 15   CREATININE 0.70 0.76 0.65 0.63  CALCIUM 9.6 9.1 9.4 9.0  PROT 6.5 6.3*  --   --   BILITOT 0.6 0.5  --   --   ALKPHOS 66 62  --   --   ALT 27 28  --   --  AST 22 22  --   --   GLUCOSE 145* 231* 103* 88   Imaging/Diagnostic Tests: No results found.    Benay Pike, MD 02/07/2019, 6:44 AM PGY-1, [place] Intern pager: [phone], text pages welcome

## 2019-02-07 NOTE — Discharge Summary (Signed)
[facility] Discharge Summary  Patient name: [name] Medical record number: [id number] Date of birth: 1956-05-31 Age: 63 y.o. Gender: female Date of Admission: 02/02/2019  Date of Discharge: 02/07/2019 Admitting Physician: Leeanne Rio, MD  Primary Care Provider: Mayra Neer, MD Consultants: None  Indication for Hospitalization: Acute hypoxic respiratory distress  Discharge Diagnoses/Problem List:  Acute hypoxic respiratory failure Hyperglycemia Hypertension Disposition: Home  Discharge Condition: Proved, stable  Discharge Exam: BP 140/76 (BP Location: Left Arm)   Pulse 98   Temp 98.2 F (36.8 C) (Oral)   Resp 15   Ht 5\' 4"  (1.626 m)   Wt 88.5 kg   SpO2 95%   BMI 33.47 kg/m  Gen: NAD, alert,  CV: Regular rate and rhythm.  Radial pulses 2+ bilaterally. No bilateral lower extremity edema. Resp: some slight wheezing bilaterally.  Mild basilar crackles.  Coughing on deep inspiration. Speaks full sentences.   Abd: Nontender and nondistended on palpation to all 4 quadrants.  Positive bowel sounds. Psych: Cooperative with exam. Pleasant. Makes eye contact.  Brief Hospital Course:  Patient presented to the hospital with increased work of breathing, shortness of breath, cough with recent recurrent sinus infections and other respiratory infections since the beginning of March.  Patient had fever prior to presentation.  Given patient's negative respiratory viral panel, she was tested for COVID.  Patient was placed in negative pressure room with special contact precautions specific to CO VID.  Patient was started on steroids,albuterol, and cefepime.  Patient's shortness of breath gradually improved during her hospital stay.  IV cefepime was transitioned to p.o. cefdinir.  COVID test was negative on 3/17.  Patient was discharged on 3/18 after improvement in respiratory status.  Hyperglycemia-patient had blood glucose level of 231 on 315.  Most  likely due to steroid use.  A1c was normal.  Hypertension- patient was started on amlodipine 5 mg for high blood pressure.  Patient was not on hypertensive medication before this admission.  Continued on discharge.  Issues for Follow Up:  1. Respiratory: Patient sent home with antibiotics, ensure she is taken complete course and that symptoms are resolving. 2. Patient has failed multiple antibiotic regimens with prolonged respiratory illness.  If patient still complaining of respiratory illness after completion of cefdinir, consider noninfectious alternatives and possible referral to pulmonology. 3. Hypertension- patient started amlodipine in the hospital 5 mg.  Assess for tolerability and compliance. 4. Hyperglycemia- no history of diabetes.  Likely secondary to corticosteroid use.  Resolved.  Consider BMP/CBG  Significant Procedures: None  Significant Labs and Imaging:  Recent Labs  Lab 02/02/19 1421 02/05/19 1231 02/06/19 0457  WBC 8.8 16.8* 14.3*  HGB 12.0 11.8* 10.8*  HCT 36.4 36.1 33.5*  PLT 414* 427* 384   Recent Labs  Lab 02/02/19 1421 02/03/19 0306 02/05/19 1231 02/06/19 0457  NA 136 134* 138 137  K 3.9 4.0 3.7 3.9  CL 101 102 102 103  CO2 26 21* 25 27  GLUCOSE 145* 231* 103* 88  BUN 8 13 15 15   CREATININE 0.70 0.76 0.65 0.63  CALCIUM 9.6 9.1 9.4 9.0  ALKPHOS 66 62  --   --   AST 22 22  --   --   ALT 27 28  --   --   ALBUMIN 3.8 3.5  --   --       Results/Tests Pending at Time of Discharge: None  Discharge Medications:  Allergies as of 02/07/2019  Reactions   Sulfur Hives   Strawberry (diagnostic) Hives   Pt states she can eat 1 or 2 strawberries if she takes benadryl 30 minutes before      Medication List    STOP taking these medications   brompheniramine-pseudoephedrine-DM 30-2-10 MG/5ML syrup   chlorpheniramine-HYDROcodone 10-8 MG/5ML Suer Commonly known as:  Tussionex Pennkinetic ER   HYDROcodone-acetaminophen 5-325 MG tablet Commonly  known as:  NORCO/VICODIN   hydrOXYzine 10 MG tablet Commonly known as:  ATARAX/VISTARIL   levofloxacin 750 MG tablet Commonly known as:  LEVAQUIN   predniSONE 10 MG tablet Commonly known as:  DELTASONE   triamcinolone cream 0.1 % Commonly known as:  KENALOG     TAKE these medications   albuterol 108 (90 Base) MCG/ACT inhaler Commonly known as:  PROVENTIL HFA;VENTOLIN HFA Inhale 2 puffs into the lungs every 4 (four) hours as needed for wheezing or shortness of breath. What changed:  Another medication with the same name was added. Make sure you understand how and when to take each.   albuterol 108 (90 Base) MCG/ACT inhaler Commonly known as:  PROVENTIL HFA;VENTOLIN HFA Inhale 2 puffs into the lungs every 2 (two) hours as needed for wheezing or shortness of breath. What changed:  You were already taking a medication with the same name, and this prescription was added. Make sure you understand how and when to take each.   ALLEGRA PO Take 1 tablet by mouth daily.   amLODipine 5 MG tablet Commonly known as:  NORVASC Take 1 tablet (5 mg total) by mouth daily. Start taking on:  February 08, 2019   azelastine 0.1 % nasal spray Commonly known as:  ASTELIN Place 2 sprays into both nostrils 2 (two) times daily.   CALCIUM PO Take 1 tablet by mouth daily.   cefdinir 300 MG capsule Commonly known as:  OMNICEF Take 1 capsule (300 mg total) by mouth every 12 (twelve) hours for 9 doses.   FISH OIL PO Take 1 capsule by mouth daily.   multivitamin with minerals Tabs tablet Take 1 tablet by mouth daily. Women's One a Day       Discharge Instructions: Please refer to Patient Instructions section of EMR for full details.  Patient was counseled important signs and symptoms that should prompt return to medical care, changes in medications, dietary instructions, activity restrictions, and follow up appointments.   Follow-Up Appointments: Follow-up Information    Mayra Neer, MD  Follow up.   Specialty:  Family Medicine Why:  please schedule a follow up appointment with your [facility] care doctor as soon as able.   Contact information: [address] [place] [address] [phone]           Benay Pike, MD 02/07/2019, 7:19 PM PGY-1, [place]

## 2019-07-10 ENCOUNTER — Other Ambulatory Visit: Payer: Self-pay | Admitting: Plastic Surgery

## 2019-10-16 ENCOUNTER — Other Ambulatory Visit: Payer: Self-pay | Admitting: Family Medicine

## 2019-10-16 DIAGNOSIS — Z1231 Encounter for screening mammogram for malignant neoplasm of breast: Secondary | ICD-10-CM

## 2019-10-17 ENCOUNTER — Encounter: Payer: Self-pay | Admitting: Gastroenterology

## 2019-11-02 ENCOUNTER — Telehealth: Payer: Self-pay | Admitting: *Deleted

## 2019-11-02 NOTE — Telephone Encounter (Signed)
Pt no show PV today. Called patient, patient's states she has Covid, had a positive Covid test on 12/10 and she will call us back when she feels better and is ready to reschedule the appointments. Pt aware she will have to be symptom free before procedure can be done.

## 2019-11-12 ENCOUNTER — Encounter: Payer: BC Managed Care – PPO | Admitting: Gastroenterology

## 2019-12-07 ENCOUNTER — Ambulatory Visit: Payer: BC Managed Care – PPO

## 2020-01-17 ENCOUNTER — Ambulatory Visit
Admission: RE | Admit: 2020-01-17 | Discharge: 2020-01-17 | Disposition: A | Discharge status: Home / Self Care | Payer: BC Managed Care – PPO | Source: Ambulatory Visit | Attending: Family Medicine | Admitting: Family Medicine

## 2020-01-17 ENCOUNTER — Other Ambulatory Visit: Payer: Self-pay

## 2020-01-17 DIAGNOSIS — Z1231 Encounter for screening mammogram for malignant neoplasm of breast: Secondary | ICD-10-CM

## 2021-03-11 ENCOUNTER — Other Ambulatory Visit: Payer: Self-pay | Admitting: Family Medicine

## 2021-03-11 DIAGNOSIS — Z1231 Encounter for screening mammogram for malignant neoplasm of breast: Secondary | ICD-10-CM

## 2021-05-01 ENCOUNTER — Other Ambulatory Visit: Payer: Self-pay

## 2021-05-01 ENCOUNTER — Ambulatory Visit
Admission: RE | Admit: 2021-05-01 | Discharge: 2021-05-01 | Disposition: A | Discharge status: Home / Self Care | Payer: BC Managed Care – PPO | Source: Ambulatory Visit | Attending: Family Medicine | Admitting: Family Medicine

## 2021-05-01 DIAGNOSIS — Z1231 Encounter for screening mammogram for malignant neoplasm of breast: Secondary | ICD-10-CM

## 2021-05-04 ENCOUNTER — Other Ambulatory Visit: Payer: Self-pay | Admitting: Family Medicine

## 2021-05-04 DIAGNOSIS — R928 Other abnormal and inconclusive findings on diagnostic imaging of breast: Secondary | ICD-10-CM

## 2021-06-30 ENCOUNTER — Other Ambulatory Visit: Payer: BC Managed Care – PPO

## 2021-07-09 ENCOUNTER — Other Ambulatory Visit: Payer: Self-pay

## 2021-07-09 ENCOUNTER — Ambulatory Visit: Payer: BC Managed Care – PPO

## 2021-07-09 ENCOUNTER — Ambulatory Visit
Admission: RE | Admit: 2021-07-09 | Discharge: 2021-07-09 | Disposition: A | Discharge status: Home / Self Care | Payer: BC Managed Care – PPO | Source: Ambulatory Visit | Attending: Family Medicine | Admitting: Family Medicine

## 2021-07-09 DIAGNOSIS — R928 Other abnormal and inconclusive findings on diagnostic imaging of breast: Secondary | ICD-10-CM

## 2023-07-19 IMAGING — MG MM DIGITAL DIAGNOSTIC UNILAT*L* W/ TOMO W/ CAD
4 series · 4 of 12 positions shown · non-contrast
Comparison: Previous exam(s).

CLINICAL DATA: The patient was called back for 2 left breast
asymmetries

EXAM:
DIGITAL DIAGNOSTIC UNILATERAL LEFT MAMMOGRAM WITH TOMOSYNTHESIS AND
CAD
TECHNIQUE: Left digital diagnostic mammography and breast tomosynthesis was
performed. The images were evaluated with computer-aided detection.

[L MLO synth-2D]
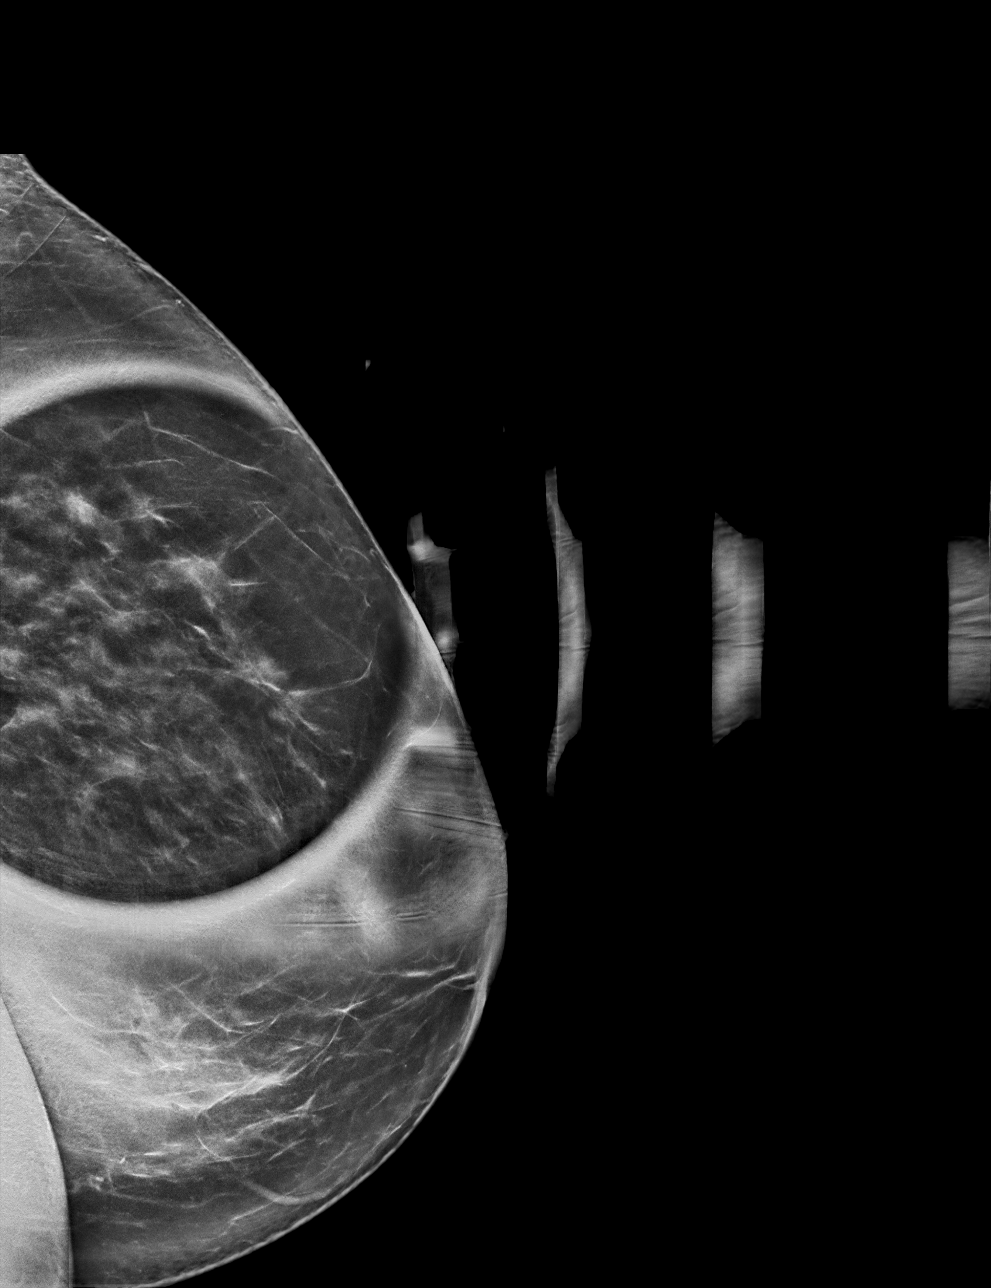

[L CC synth-2D]
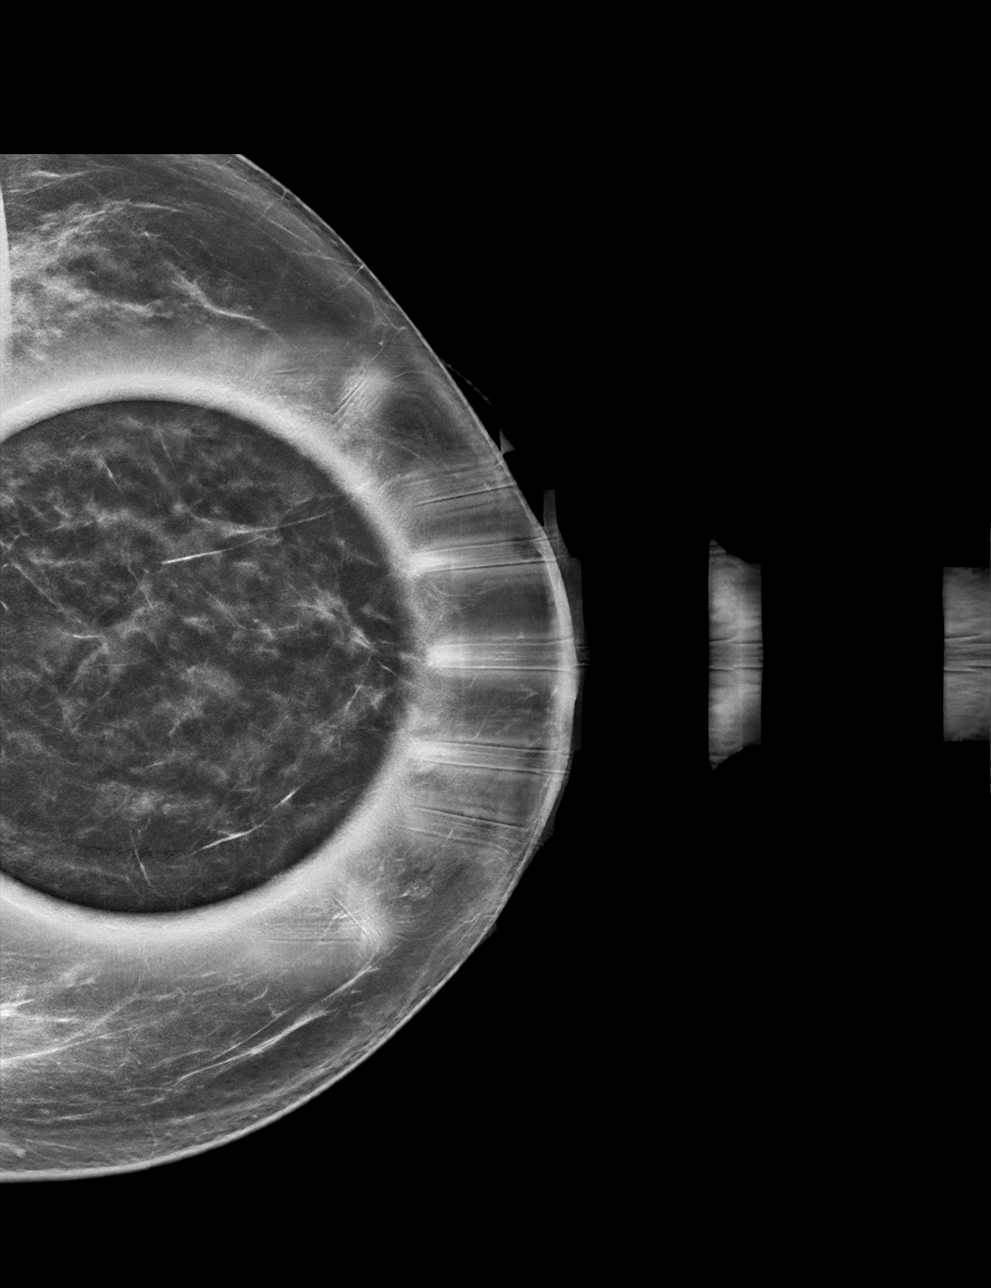

[L MLO tomo · tomo slice 36/71.0]
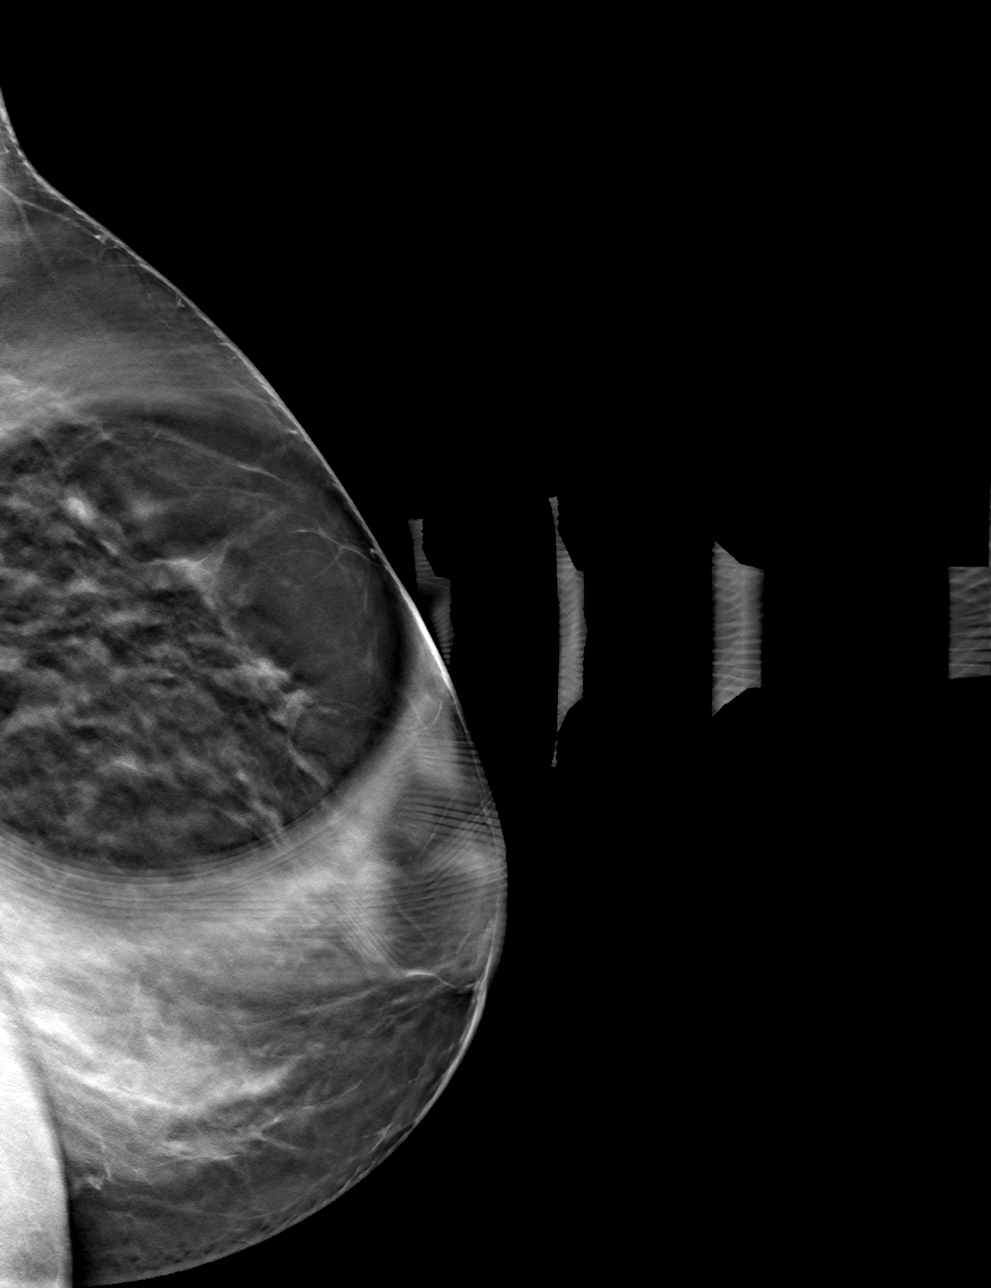

[L CC tomo · tomo slice 35/69.0]
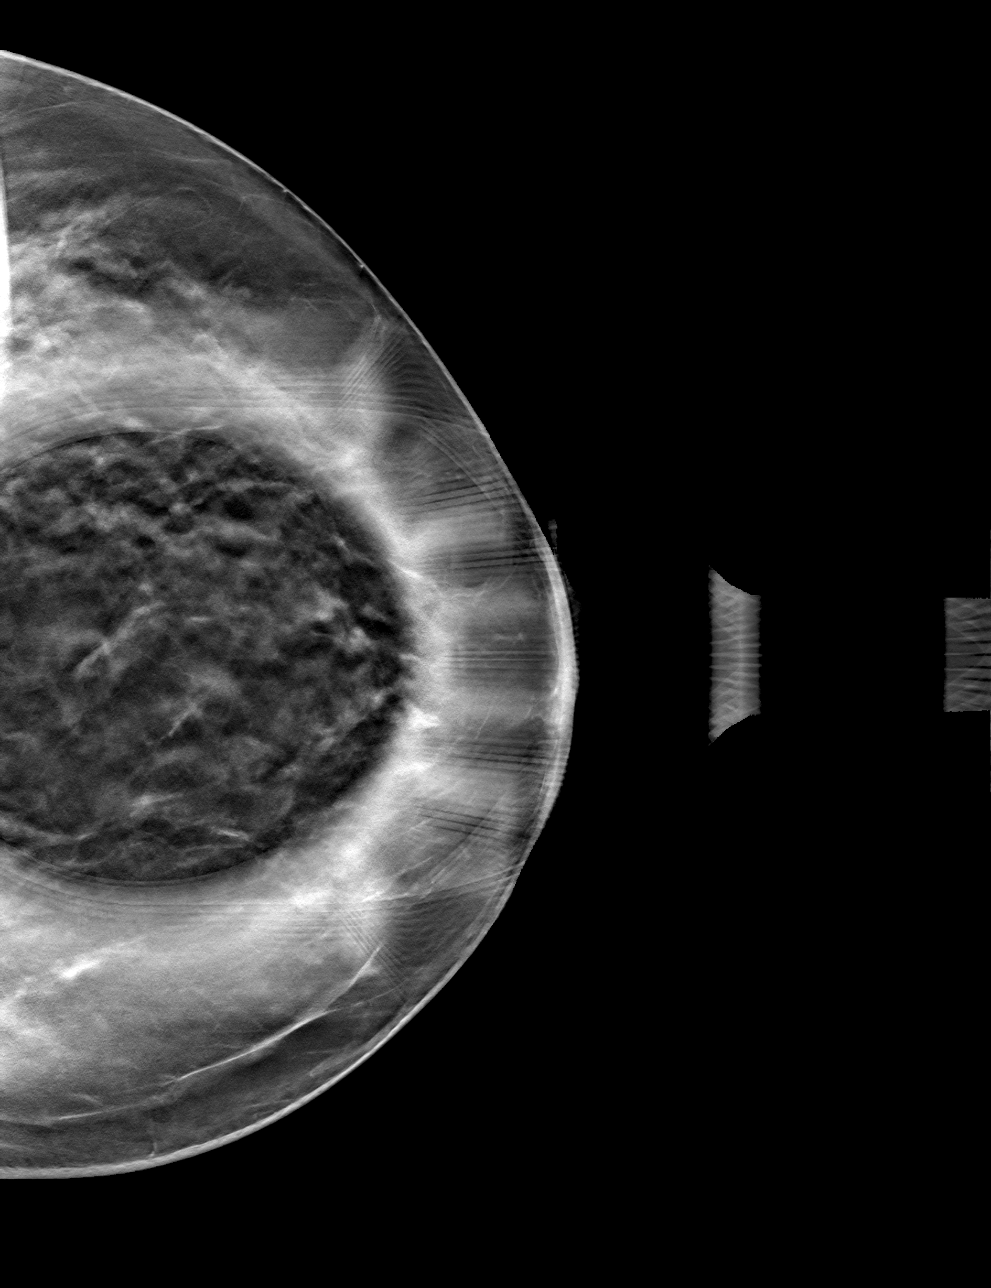

[4 of 12 positions shown; findings below may reference images not displayed]

ACR Breast Density Category c: The breast tissue is heterogeneously
dense, which may obscure small masses.
FINDINGS: The left breast asymmetries resolve on today's imaging.
IMPRESSION: No mammographic evidence of malignancy.

RECOMMENDATION:
Annual screening mammography.

I have discussed the findings and recommendations with the patient.
If applicable, a reminder letter will be sent to the patient
regarding the next appointment.

BI-RADS CATEGORY  2: Benign.

## 2024-06-20 ENCOUNTER — Ambulatory Visit
Admission: RE | Admit: 2024-06-20 | Discharge: 2024-06-20 | Disposition: A | Discharge status: Home / Self Care | Source: Ambulatory Visit | Attending: Family Medicine | Admitting: Family Medicine

## 2024-06-20 ENCOUNTER — Other Ambulatory Visit: Payer: Self-pay | Admitting: Family Medicine

## 2024-06-20 DIAGNOSIS — Z1231 Encounter for screening mammogram for malignant neoplasm of breast: Secondary | ICD-10-CM
